# Patient Record
Sex: Female | Born: 1985 | Race: Asian | Hispanic: No | Marital: Married | Smoking: Never smoker
Health system: Southern US, Community
[De-identification: ages and names within clinical notes are randomized; demographics above are authoritative.]

## PROBLEM LIST (undated history)

## (undated) DIAGNOSIS — K259 Gastric ulcer, unspecified as acute or chronic, without hemorrhage or perforation: Secondary | ICD-10-CM

## (undated) DIAGNOSIS — Z227 Latent tuberculosis: Secondary | ICD-10-CM

## (undated) HISTORY — DX: Latent tuberculosis: Z22.7

## (undated) HISTORY — PX: NO PAST SURGERIES: SHX2092

## (undated) HISTORY — DX: Gastric ulcer, unspecified as acute or chronic, without hemorrhage or perforation: K25.9

---

## 2012-11-26 ENCOUNTER — Encounter: Payer: Self-pay | Admitting: Obstetrics and Gynecology

## 2012-11-26 ENCOUNTER — Ambulatory Visit (INDEPENDENT_AMBULATORY_CARE_PROVIDER_SITE_OTHER): Payer: Self-pay | Admitting: Obstetrics and Gynecology

## 2012-11-26 DIAGNOSIS — Z331 Pregnant state, incidental: Secondary | ICD-10-CM

## 2012-11-28 LAB — CULTURE, OB URINE
Colony Count: NO GROWTH
Organism ID, Bacteria: NO GROWTH

## 2012-11-29 LAB — PRENATAL PANEL VII
Eosinophils Absolute: 0.1 10*3/uL (ref 0.0–0.7)
HIV: NONREACTIVE
Hemoglobin: 12.2 g/dL (ref 12.0–15.0)
Hepatitis B Surface Ag: NEGATIVE
Lymphocytes Relative: 23 % (ref 12–46)
Lymphs Abs: 1.5 10*3/uL (ref 0.7–4.0)
MCH: 28.1 pg (ref 26.0–34.0)
Monocytes Relative: 7 % (ref 3–12)
Neutrophils Relative %: 68 % (ref 43–77)
Platelets: 218 10*3/uL (ref 150–400)
RBC: 4.34 MIL/uL (ref 3.87–5.11)
Rubella: 3.01 Index — ABNORMAL HIGH (ref <0.90)
WBC: 6.5 10*3/uL (ref 4.0–10.5)

## 2012-11-30 LAB — HEMOGLOBINOPATHY EVALUATION
Hemoglobin Other: 0 %
Hgb A: 96.6 % — ABNORMAL LOW (ref 96.8–97.8)
Hgb S Quant: 0 %

## 2012-11-30 NOTE — Progress Notes (Signed)
NOB interview completed.  Pt has a language barrier, husband translates for her.  Pt advised to start PNV ASAP.  NOB work up w/ AVS on 12/23/11.  Doing well.

## 2012-12-15 NOTE — L&D Delivery Note (Signed)
Attestation of Attending Supervision of Advanced Practitioner (CNM/NP): Evaluation and management procedures were performed by the Advanced Practitioner under my supervision and collaboration. I have reviewed the Advanced Practitioner's note and chart, and I agree with the management and plan.  [name] 10:37 PM   

## 2012-12-15 NOTE — L&D Delivery Note (Signed)
  Delivery Note At 7:31 PM a viable female was delivered via Vaginal, Spontaneous Delivery (Presentation: Left Occiput Anterior).  APGAR: 9, 9; weight 6 lb 10.3 oz (3014 g).   Placenta status: Intact, Spontaneous.  Cord: 3 vessels,  with the following complications: None.  Cord pH: not done  Anesthesia: Epidural  Episiotomy: None Lacerations: 2nd degree;Perineal Suture Repair: 3.0 monocryl Est. Blood Loss (mL): 150  Mom to postpartum.  Baby to nursery-stable. Breastfeeding, undecided about contraception- thinking about depo vs. pills  I was present for birth that was attended by Dr. Claiborne Billings, I agree w/ her note below  Marge Duncans 06/04/2013, 9:18 PM     Delivery Summary for [facility] 27 y.o.  G1P1 [redacted]w[redacted]d presented to [facility] in Early labor  Labor Events:   Preterm labor: NA  Rupture date: 06/04/2013  Rupture time: unknown  Rupture type: Spontaneous  Fluid Color: Bloody  Induction: NA  Augmentation: Pitocin  Complications: none  Cervical ripening:  none        Delivery:   Episiotomy: no  Lacerations: 2nd degree  Repair suture: 3-0 Monocryl  Repair # of packets: 1  Blood loss (ml): 150 mL   Information for the patient's newborn:  Burdette, Gergely [[id number]]    Delivery 06/04/2013 7:31 PM by  Vaginal, Spontaneous Delivery Sex:  female Gestational Age: <None> Delivery Clinician:  Natalia Leatherwood Living?: Yes        APGARS  One minute Five minutes Ten minutes  Skin color: 1   1      Heart rate: 2   2      Grimace: 2   2      Muscle tone: 2   2      Breathing: 2   2      Totals: 9  9      Presentation/position: Vertex  Left Occiput Anterior Resuscitation: None  Cord information:    Disposition of cord blood: No    Blood gases sent? No Complications: None  Placenta: Delivered: 06/04/2013 7:38 PM  Spontaneous  Intact appearance Newborn Measurements: Weight:   Height:   Head circumference:   Chest circumference:   Other providers:  Tarry Kos Ansah-Mensah  Additional  information: Forceps:   Vacuum:   Breech:   Observed anomalies

## 2012-12-22 ENCOUNTER — Ambulatory Visit (INDEPENDENT_AMBULATORY_CARE_PROVIDER_SITE_OTHER): Payer: Self-pay | Admitting: Obstetrics and Gynecology

## 2012-12-22 ENCOUNTER — Encounter: Payer: Self-pay | Admitting: Obstetrics and Gynecology

## 2012-12-22 VITALS — BP 100/62 | Ht 64.0 in | Wt 117.0 lb

## 2012-12-22 DIAGNOSIS — Z124 Encounter for screening for malignant neoplasm of cervix: Secondary | ICD-10-CM

## 2012-12-22 DIAGNOSIS — Z3689 Encounter for other specified antenatal screening: Secondary | ICD-10-CM

## 2012-12-22 DIAGNOSIS — Z331 Pregnant state, incidental: Secondary | ICD-10-CM

## 2012-12-22 LAB — OB RESULTS CONSOLE GC/CHLAMYDIA
Chlamydia: NEGATIVE
Gonorrhea: NEGATIVE

## 2012-12-22 NOTE — Patient Instructions (Signed)
ABCs of Pregnancy  A  Antepartum care is very important. Be sure you see your doctor and get prenatal care as soon as you think you are pregnant. At this time, you will be tested for infection, genetic abnormalities and potential problems with you and the pregnancy. This is the time to discuss diet, exercise, work, medications, labor, pain medication during labor and the possibility of a cesarean delivery. Ask any questions that may concern you. It is important to see your doctor regularly throughout your pregnancy. Avoid exposure to toxic substances and chemicals - such as cleaning solvents, lead and mercury, some insecticides, and paint. Pregnant women should avoid exposure to paint fumes, and fumes that cause you to feel ill, dizzy or faint. When possible, it is a good idea to have a pre-pregnancy consultation with your caregiver to begin some important recommendations your caregiver suggests such as, taking folic acid, exercising, quitting smoking, avoiding alcoholic beverages, etc.  B  Breastfeeding is the healthiest choice for both you and your baby. It has many nutritional benefits for the baby and health benefits for the mother. It also creates a very tight and loving bond between the baby and mother. Talk to your doctor, your family and friends, and your employer about how you choose to feed your baby and how they can support you in your decision. Not all birth defects can be prevented, but a woman can take actions that may increase her chance of having a healthy baby. Many birth defects happen very early in pregnancy, sometimes before a woman even knows she is pregnant. Birth defects or abnormalities of any child in your or the father's family should be discussed with your caregiver. Get a good support bra as your breast size changes. Wear it especially when you exercise and when nursing.   C  Celebrate the news of your pregnancy with the your spouse/father and family. Childbirth classes are helpful to  take for you and the spouse/father because it helps to understand what happens during the pregnancy, labor and delivery. Cesarean delivery should be discussed with your doctor so you are prepared for that possibility. The pros and cons of circumcision if it is a boy, should be discussed with your pediatrician. Cigarette smoking during pregnancy can result in low birth weight babies. It has been associated with infertility, miscarriages, tubal pregnancies, infant death (mortality) and poor health (morbidity) in childhood. Additionally, cigarette smoking may cause long-term learning disabilities. If you smoke, you should try to quit before getting pregnant and not smoke during the pregnancy. Secondary smoke may also harm a mother and her developing baby. It is a good idea to ask people to stop smoking around you during your pregnancy and after the baby is born. Extra calcium is necessary when you are pregnant and is found in your prenatal vitamin, in dairy products, green leafy vegetables and in calcium supplements.  D  A healthy diet according to your current weight and height, along with vitamins and mineral supplements should be discussed with your caregiver. Domestic abuse or violence should be made known to your doctor right away to get the situation corrected. Drink more water when you exercise to keep hydrated. Discomfort of your back and legs usually develops and progresses from the middle of the second trimester through to delivery of the baby. This is because of the enlarging baby and uterus, which may also affect your balance. Do not take illegal drugs. Illegal drugs can seriously harm the baby and you. Drink extra   fluids (water is best) throughout pregnancy to help your body keep up with the increases in your blood volume. Drink at least 6 to 8 glasses of water, fruit juice, or milk each day. A good way to know you are drinking enough fluid is when your urine looks almost like clear water or is very light  yellow.   E  Eat healthy to get the nutrients you and your unborn baby need. Your meals should include the five basic food groups. Exercise (30 minutes of light to moderate exercise a day) is important and encouraged during pregnancy, if there are no medical problems or problems with the pregnancy. Exercise that causes discomfort or dizziness should be stopped and reported to your caregiver. Emotions during pregnancy can change from being ecstatic to depression and should be understood by you, your partner and your family.  F  Fetal screening with ultrasound, amniocentesis and monitoring during pregnancy and labor is common and sometimes necessary. Take 400 micrograms of folic acid daily both before, when possible, and during the first few months of pregnancy to reduce the risk of birth defects of the brain and spine. All women who could possibly become pregnant should take a vitamin with folic acid, every day. It is also important to eat a healthy diet with fortified foods (enriched grain products, including cereals, rice, breads, and pastas) and foods with natural sources of folate (orange juice, green leafy vegetables, beans, peanuts, broccoli, asparagus, peas, and lentils). The father should be involved with all aspects of the pregnancy including, the prenatal care, childbirth classes, labor, delivery, and postpartum time. Fathers may also have emotional concerns about being a father, financial needs, and raising a family.  G  Genetic testing should be done appropriately. It is important to know your family and the father's history. If there have been problems with pregnancies or birth defects in your family, report these to your doctor. Also, genetic counselors can talk with you about the information you might need in making decisions about having a family. You can call a major medical center in your area for help in finding a board-certified genetic counselor. Genetic testing and counseling should be done  before pregnancy when possible, especially if there is a history of problems in the mother's or father's family. Certain ethnic backgrounds are more at risk for genetic defects.  H  Get familiar with the hospital where you will be having your baby. Get to know how long it takes to get there, the labor and delivery area, and the hospital procedures. Be sure your medical insurance is accepted there. Get your home ready for the baby including, clothes, the baby's room (when possible), furniture and car seat. Hand washing is important throughout the day, especially after handling raw meat and poultry, changing the baby's diaper or using the bathroom. This can help prevent the spread of many bacteria and viruses that cause infection. Your hair may become dry and thinner, but will return to normal a few weeks after the baby is born. Heartburn is a common problem that can be treated by taking antacids recommended by your caregiver, eating smaller meals 5 or 6 times a day, not drinking liquids when eating, drinking between meals and raising the head of your bed 2 to 3 inches.  I  Insurance to cover you, the baby, doctor and hospital should be reviewed so that you will be prepared to pay any costs not covered by your insurance plan. If you do not have medical insurance,   there are usually clinics and services available for you in your community. Take 30 milligrams of iron during your pregnancy as prescribed by your doctor to reduce the risk of low red blood cells (anemia) later in pregnancy. All women of childbearing age should eat a diet rich in iron.  J  There should be a joint effort for the mother, father and any other children to adapt to the pregnancy financially, emotionally, and psychologically during the pregnancy. Join a support group for moms-to-be. Or, join a class on parenting or childbirth. Have the family participate when possible.  K  Know your limits. Let your caregiver know if you experience any of the  following:   · Pain of any kind.  · Strong cramps.  · You develop a lot of weight in a short period of time (5 pounds in 3 to 5 days).  · Vaginal bleeding, leaking of amniotic fluid.  · Headache, vision problems.  · Dizziness, fainting, shortness of breath.  · Chest pain.  · Fever of 102° F (38.9° C) or higher.  · Gush of clear fluid from your vagina.  · Painful urination.  · Domestic violence.  · Irregular heartbeat (palpitations).  · Rapid beating of the heart (tachycardia).  · Constant feeling sick to your stomach (nauseous) and vomiting.  · Trouble walking, fluid retention (edema).  · Muscle weakness.  · If your baby has decreased activity.  · Persistent diarrhea.  · Abnormal vaginal discharge.  · Uterine contractions at 20-minute intervals.  · Back pain that travels down your leg.  L  Learn and practice that what you eat and drink should be in moderation and healthy for you and your baby. Legal drugs such as alcohol and caffeine are important issues for pregnant women. There is no safe amount of alcohol a woman can drink while pregnant. Fetal alcohol syndrome, a disorder characterized by growth retardation, facial abnormalities, and central nervous system dysfunction, is caused by a woman's use of alcohol during pregnancy. Caffeine, found in tea, coffee, soft drinks and chocolate, should also be limited. Be sure to read labels when trying to cut down on caffeine during pregnancy. More than 200 foods, beverages, and over-the-counter medications contain caffeine and have a high salt content! There are coffees and teas that do not contain caffeine.  M  Medical conditions such as diabetes, epilepsy, and high blood pressure should be treated and kept under control before pregnancy when possible, but especially during pregnancy. Ask your caregiver about any medications that may need to be changed or adjusted during pregnancy. If you are currently taking any medications, ask your caregiver if it is safe to take them  while you are pregnant or before getting pregnant when possible. Also, be sure to discuss any herbs or vitamins you are taking. They are medicines, too! Discuss with your doctor all medications, prescribed and over-the-counter, that you are taking. During your prenatal visit, discuss the medications your doctor may give you during labor and delivery.  N  Never be afraid to ask your doctor or caregiver questions about your health, the progress of the pregnancy, family problems, stressful situations, and recommendation for a pediatrician, if you do not have one. It is better to take all precautions and discuss any questions or concerns you may have during your office visits. It is a good idea to write down your questions before you visit the doctor.  O  Over-the-counter cough and cold remedies may contain alcohol or other ingredients that should   be avoided during pregnancy. Ask your caregiver about prescription, herbs or over-the-counter medications that you are taking or may consider taking while pregnant.   P  Physical activity during pregnancy can benefit both you and your baby by lessening discomfort and fatigue, providing a sense of well-being, and increasing the likelihood of early recovery after delivery. Light to moderate exercise during pregnancy strengthens the belly (abdominal) and back muscles. This helps improve posture. Practicing yoga, walking, swimming, and cycling on a stationary bicycle are usually safe exercises for pregnant women. Avoid scuba diving, exercise at high altitudes (over 3000 feet), skiing, horseback riding, contact sports, etc. Always check with your doctor before beginning any kind of exercise, especially during pregnancy and especially if you did not exercise before getting pregnant.  Q  Queasiness, stomach upset and morning sickness are common during pregnancy. Eating a couple of crackers or dry toast before getting out of bed. Foods that you normally love may make you feel sick to  your stomach. You may need to substitute other nutritious foods. Eating 5 or 6 small meals a day instead of 3 large ones may make you feel better. Do not drink with your meals, drink between meals. Questions that you have should be written down and asked during your prenatal visits.  R  Read about and make plans to baby-proof your home. There are important tips for making your home a safer environment for your baby. Review the tips and make your home safer for you and your baby. Read food labels regarding calories, salt and fat content in the food.  S  Saunas, hot tubs, and steam rooms should be avoided while you are pregnant. Excessive high heat may be harmful during your pregnancy. Your caregiver will screen and examine you for sexually transmitted diseases and genetic disorders during your prenatal visits. Learn the signs of labor. Sexual relations while pregnant is safe unless there is a medical or pregnancy problem and your caregiver advises against it.  T  Traveling long distances should be avoided especially in the third trimester of your pregnancy. If you do have to travel out of state, be sure to take a copy of your medical records and medical insurance plan with you. You should not travel long distances without seeing your doctor first. Most airlines will not allow you to travel after 36 weeks of pregnancy. Toxoplasmosis is an infection caused by a parasite that can seriously harm an unborn baby. Avoid eating undercooked meat and handling cat litter. Be sure to wear gloves when gardening. Tingling of the hands and fingers is not unusual and is due to fluid retention. This will go away after the baby is born.  U  Womb (uterus) size increases during the first trimester. Your kidneys will begin to function more efficiently. This may cause you to feel the need to urinate more often. You may also leak urine when sneezing, coughing or laughing. This is due to the growing uterus pressing against your bladder,  which lies directly in front of and slightly under the uterus during the first few months of pregnancy. If you experience burning along with frequency of urination or bloody urine, be sure to tell your doctor. The size of your uterus in the third trimester may cause a problem with your balance. It is advisable to maintain good posture and avoid wearing high heels during this time. An ultrasound of your baby may be necessary during your pregnancy and is safe for you and your baby.  V    Vaccinations are an important concern for pregnant women. Get needed vaccines before pregnancy. Center for Disease Control (www.cdc.gov) has clear guidelines for the use of vaccines during pregnancy. Review the list, be sure to discuss it with your doctor. Prenatal vitamins are helpful and healthy for you and the baby. Do not take extra vitamins except what is recommended. Taking too much of certain vitamins can cause overdose problems. Continuous vomiting should be reported to your caregiver. Varicose veins may appear especially if there is a family history of varicose veins. They should subside after the delivery of the baby. Support hose helps if there is leg discomfort.  W  Being overweight or underweight during pregnancy may cause problems. Try to get within 15 pounds of your ideal weight before pregnancy. Remember, pregnancy is not a time to be dieting! Do not stop eating or start skipping meals as your weight increases. Both you and your baby need the calories and nutrition you receive from a healthy diet. Be sure to consult with your doctor about your diet. There is a formula and diet plan available depending on whether you are overweight or underweight. Your caregiver or nutritionist can help and advise you if necessary.  X  Avoid X-rays. If you must have dental work or diagnostic tests, tell your dentist or physician that you are pregnant so that extra care can be taken. X-rays should only be taken when the risks of not taking  them outweigh the risk of taking them. If needed, only the minimum amount of radiation should be used. When X-rays are necessary, protective lead shields should be used to cover areas of the body that are not being X-rayed.  Y  Your baby loves you. Breastfeeding your baby creates a loving and very close bond between the two of you. Give your baby a healthy environment to live in while you are pregnant. Infants and children require constant care and guidance. Their health and safety should be carefully watched at all times. After the baby is born, rest or take a nap when the baby is sleeping.  Z  Get your ZZZs. Be sure to get plenty of rest. Resting on your side as often as possible, especially on your left side is advised. It provides the best circulation to your baby and helps reduce swelling. Try taking a nap for 30 to 45 minutes in the afternoon when possible. After the baby is born rest or take a nap when the baby is sleeping. Try elevating your feet for that amount of time when possible. It helps the circulation in your legs and helps reduce swelling.   Most information courtesy of the CDC.  Document Released: 12/01/2005 Document Revised: 02/23/2012 Document Reviewed: 08/15/2009  [employer]® Patient Information ©2013 [employer].

## 2012-12-22 NOTE — Progress Notes (Signed)
[facility] NEW OB EXAMINATION   Elizabeth Foley is a 27 y.o. female, G1P0, who presents at [redacted]w[redacted]d gestation for a new obstetrical examination. Her husband is her interpreter. She denies problems with this pregnancy. She says her last menstrual period was normal.  The following portions of the patient's history were reviewed and updated as appropriate: allergies, current medications, past family history, past medical history, past social history, past surgical history and problem list.  OB History    Grav Para Term Preterm Abortions TAB SAB Ect Mult Living   1               Past Medical History  Diagnosis Date  . No pertinent past medical history     Past Surgical History  Procedure Date  . No past surgeries     No family history on file.  Social History:  reports that she has never smoked. She has never used smokeless tobacco. She reports that she does not drink alcohol or use illicit drugs.  Allergies: No Known Allergies  Medications: prenatal vitamins   Objective:    BP 100/62  Ht 5\' 4"  (1.626 m)  Wt 117 lb (53.071 kg)  BMI 20.08 kg/m2  LMP 08/30/2012    Weight:  Wt Readings from Last 1 Encounters:  12/22/12 117 lb (53.071 kg)          BMI: Body mass index is 20.08 kg/(m^2).  General Appearance: Alert, appropriate appearance for age. No acute distress HEENT: Grossly normal Neck / Thyroid: Supple, no masses, nodes or enlargement Lungs: clear to auscultation bilaterally Back: No CVA tenderness Breast Exam: No masses or nodes.No dimpling, nipple retraction or discharge. Cardiovascular: Regular rate and rhythm. S1, S2, no murmur Gastrointestinal: Soft, non-tender, no masses or organomegaly.                               Fundal height: 16 weeks                               Fetal heart tones audible: yes  ++++++++++++++++++++++++++++++++++++++++++++++++++++++++  Pelvic Exam: External genitalia: normal general appearance Vaginal: normal without tenderness, induration or  masses and relaxation: No Cervix: normal appearance Adnexa: normal bimanual exam Uterus: gravid, nontender, 16 weeks size  ++++++++++++++++++++++++++++++++++++++++++++++++++++++++  Lymphatic Exam: Non-palpable nodes in neck, clavicular, axillary, or inguinal regions Neurologic: Normal speech, no tremor  Psychiatric: Alert and oriented, appropriate affect.  Prenatal labs: ABO, Rh: O/POS/-- (12/13 0935) Antibody: NEG (12/13 0935) Rubella:  immune RPR: NON REAC (12/13 0935)  HBsAg: NEGATIVE (12/13 0935)  HIV: NON REACTIVE (12/13 0935)  GBS:   pending until the third trimester Urine culture: Negative Sickle cell: Normal adult hemoglobin Hemoglobin: 12.2 Platelets: 218,000  Wet Prep:   Previously done:            no                     If no: Whiff:                     Negative                              Clue cells:             no  PH:                        4.5                              Yeast:                    no                              Trichomoniasis:    no   Assessment:   27 y.o. female G1P0 at [redacted]w[redacted]d gestation ( EDC is June 06, 2013) by: Normal Last menstrual period: yes                                Late prenatal care   Plan:    GC and Chlamydia sent.  Pap smear sent.  We discussed routine pregnancy issues:  Toxoplasmosis was reviewed.  The patient was told to avoid cat liter boxes and feces.  The patient was told to avoid predator fish including tuna because of our concerns for mercury consumption.  The patient was told to avoid soft cheeses.  The patient was told to be sure that all lunch meats are well cooked.  Genetic screening was discussed. The patient wants to have a genetic screen at next visit (when she has Medicaid).  Our model for pregnancy management was reviewed.  Proper diet and exercise reviewed.  Return to office in 4 weeks.  Medications include:  Prenatal vitamins  ABC's of pregnancy  given.  Mylinda Latina.D.

## 2012-12-22 NOTE — Progress Notes (Signed)
[redacted]w[redacted]d  Last Pap: per husband never Pt would like to discuss Genetic Testing. Pt states she has no concerns. Pt states she has already had a flu shot.

## 2012-12-23 ENCOUNTER — Telehealth: Payer: Self-pay | Admitting: Obstetrics and Gynecology

## 2012-12-23 MED ORDER — CONCEPT OB 130-92.4-1 MG PO CAPS: 1.0000 | ORAL_CAPSULE | Freq: Every day | ORAL | Status: DC

## 2012-12-23 NOTE — Telephone Encounter (Signed)
Lm on vm rx sen to pharm pt voice understanding

## 2012-12-24 LAB — PAP IG, CT-NG, RFX HPV ASCU
Chlamydia Probe Amp: NEGATIVE
GC Probe Amp: NEGATIVE

## 2013-01-19 ENCOUNTER — Other Ambulatory Visit: Payer: Self-pay

## 2013-01-19 ENCOUNTER — Encounter: Payer: Self-pay | Admitting: Obstetrics and Gynecology

## 2013-01-31 ENCOUNTER — Other Ambulatory Visit: Payer: Self-pay

## 2013-01-31 DIAGNOSIS — Z331 Pregnant state, incidental: Secondary | ICD-10-CM

## 2013-01-31 LAB — HIV ANTIBODY (ROUTINE TESTING W REFLEX): HIV: NONREACTIVE

## 2013-01-31 NOTE — Progress Notes (Signed)
PRENATAL LABS DONE TODAY Elizabeth Foley 

## 2013-02-01 LAB — OBSTETRIC PANEL
Antibody Screen: NEGATIVE
Basophils Absolute: 0 10*3/uL (ref 0.0–0.1)
Basophils Relative: 0 % (ref 0–1)
Eosinophils Relative: 1 % (ref 0–5)
HCT: 32.3 % — ABNORMAL LOW (ref 36.0–46.0)
Hemoglobin: 10.8 g/dL — ABNORMAL LOW (ref 12.0–15.0)
MCH: 28.9 pg (ref 26.0–34.0)
MCHC: 33.4 g/dL (ref 30.0–36.0)
MCV: 86.4 fL (ref 78.0–100.0)
Monocytes Absolute: 0.5 10*3/uL (ref 0.1–1.0)
Monocytes Relative: 7 % (ref 3–12)
Neutro Abs: 5.5 10*3/uL (ref 1.7–7.7)
RDW: 13.8 % (ref 11.5–15.5)
Rh Type: POSITIVE

## 2013-02-07 ENCOUNTER — Encounter: Payer: Self-pay | Admitting: Family Medicine

## 2013-02-11 ENCOUNTER — Encounter: Payer: Self-pay | Admitting: Family Medicine

## 2013-02-11 ENCOUNTER — Ambulatory Visit (INDEPENDENT_AMBULATORY_CARE_PROVIDER_SITE_OTHER): Payer: Self-pay | Admitting: Family Medicine

## 2013-02-11 VITALS — BP 109/69 | Wt 131.3 lb

## 2013-02-11 DIAGNOSIS — Z3400 Encounter for supervision of normal first pregnancy, unspecified trimester: Secondary | ICD-10-CM | POA: Insufficient documentation

## 2013-02-11 DIAGNOSIS — Z3402 Encounter for supervision of normal first pregnancy, second trimester: Secondary | ICD-10-CM

## 2013-02-11 NOTE — Patient Instructions (Signed)
TSH today, continue daily vitamins, MAU for emergency, Next appt in 4 weeks  Pregnancy - Second Trimester The second trimester of pregnancy (3 to 6 months) is a period of rapid growth for you and your baby. At the end of the sixth month, your baby is about 9 inches long and weighs 1 1/2 pounds. You will begin to feel the baby move between 18 and 20 weeks of the pregnancy. This is called quickening. Weight gain is faster. A clear fluid (colostrum) may leak out of your breasts. You may feel small contractions of the womb (uterus). This is known as false labor or Braxton-Hicks contractions. This is like a practice for labor when the baby is ready to be born. Usually, the problems with morning sickness have usually passed by the end of your first trimester. Some women develop small dark blotches (called cholasma, mask of pregnancy) on their face that usually goes away after the baby is born. Exposure to the sun makes the blotches worse. Acne may also develop in some pregnant women and pregnant women who have acne, may find that it goes away. PRENATAL EXAMS  Blood work may continue to be done during prenatal exams. These tests are done to check on your health and the probable health of your baby. Blood work is used to follow your blood levels (hemoglobin). Anemia (low hemoglobin) is common during pregnancy. Iron and vitamins are given to help prevent this. You will also be checked for diabetes between 24 and 28 weeks of the pregnancy. Some of the previous blood tests may be repeated.  The size of the uterus is measured during each visit. This is to make sure that the baby is continuing to grow properly according to the dates of the pregnancy.  Your blood pressure is checked every prenatal visit. This is to make sure you are not getting toxemia.  Your urine is checked to make sure you do not have an infection, diabetes or protein in the urine.  Your weight is checked often to make sure gains are happening at  the suggested rate. This is to ensure that both you and your baby are growing normally.  Sometimes, an ultrasound is performed to confirm the proper growth and development of the baby. This is a test which bounces harmless sound waves off the baby so your caregiver can more accurately determine due dates. Sometimes, a specialized test is done on the amniotic fluid surrounding the baby. This test is called an amniocentesis. The amniotic fluid is obtained by sticking a needle into the belly (abdomen). This is done to check the chromosomes in instances where there is a concern about possible genetic problems with the baby. It is also sometimes done near the end of pregnancy if an early delivery is required. In this case, it is done to help make sure the baby's lungs are mature enough for the baby to live outside of the womb. CHANGES OCCURING IN THE SECOND TRIMESTER OF PREGNANCY Your body goes through many changes during pregnancy. They vary from person to person. Talk to your caregiver about changes you notice that you are concerned about.  During the second trimester, you will likely have an increase in your appetite. It is normal to have cravings for certain foods. This varies from person to person and pregnancy to pregnancy.  Your lower abdomen will begin to bulge.  You may have to urinate more often because the uterus and baby are pressing on your bladder. It is also common to  get more bladder infections during pregnancy (pain with urination). You can help this by drinking lots of fluids and emptying your bladder before and after intercourse.  You may begin to get stretch marks on your hips, abdomen, and breasts. These are normal changes in the body during pregnancy. There are no exercises or medications to take that prevent this change.  You may begin to develop swollen and bulging veins (varicose veins) in your legs. Wearing support hose, elevating your feet for 15 minutes, 3 to 4 times a day and  limiting salt in your diet helps lessen the problem.  Heartburn may develop as the uterus grows and pushes up against the stomach. Antacids recommended by your caregiver helps with this problem. Also, eating smaller meals 4 to 5 times a day helps.  Constipation can be treated with a stool softener or adding bulk to your diet. Drinking lots of fluids, vegetables, fruits, and whole grains are helpful.  Exercising is also helpful. If you have been very active up until your pregnancy, most of these activities can be continued during your pregnancy. If you have been less active, it is helpful to start an exercise program such as walking.  Hemorrhoids (varicose veins in the rectum) may develop at the end of the second trimester. Warm sitz baths and hemorrhoid cream recommended by your caregiver helps hemorrhoid problems.  Backaches may develop during this time of your pregnancy. Avoid heavy lifting, wear low heal shoes and practice good posture to help with backache problems.  Some pregnant women develop tingling and numbness of their hand and fingers because of swelling and tightening of ligaments in the wrist (carpel tunnel syndrome). This goes away after the baby is born.  As your breasts enlarge, you may have to get a bigger bra. Get a comfortable, cotton, support bra. Do not get a nursing bra until the last month of the pregnancy if you will be nursing the baby.  You may get a dark line from your belly button to the pubic area called the linea nigra.  You may develop rosy cheeks because of increase blood flow to the face.  You may develop spider looking lines of the face, neck, arms and chest. These go away after the baby is born. HOME CARE INSTRUCTIONS   It is extremely important to avoid all smoking, herbs, alcohol, and unprescribed drugs during your pregnancy. These chemicals affect the formation and growth of the baby. Avoid these chemicals throughout the pregnancy to ensure the delivery of  a healthy infant.  Most of your home care instructions are the same as suggested for the first trimester of your pregnancy. Keep your caregiver's appointments. Follow your caregiver's instructions regarding medication use, exercise and diet.  During pregnancy, you are providing food for you and your baby. Continue to eat regular, well-balanced meals. Choose foods such as meat, fish, milk and other low fat dairy products, vegetables, fruits, and whole-grain breads and cereals. Your caregiver will tell you of the ideal weight gain.  A physical sexual relationship may be continued up until near the end of pregnancy if there are no other problems. Problems could include early (premature) leaking of amniotic fluid from the membranes, vaginal bleeding, abdominal pain, or other medical or pregnancy problems.  Exercise regularly if there are no restrictions. Check with your caregiver if you are unsure of the safety of some of your exercises. The greatest weight gain will occur in the last 2 trimesters of pregnancy. Exercise will help you:  Control  your weight.  Get you in shape for labor and delivery.  Lose weight after you have the baby.  Wear a good support or jogging bra for breast tenderness during pregnancy. This may help if worn during sleep. Pads or tissues may be used in the bra if you are leaking colostrum.  Do not use hot tubs, steam rooms or saunas throughout the pregnancy.  Wear your seat belt at all times when driving. This protects you and your baby if you are in an accident.  Avoid raw meat, uncooked cheese, cat litter boxes and soil used by cats. These carry germs that can cause birth defects in the baby.  The second trimester is also a good time to visit your dentist for your dental health if this has not been done yet. Getting your teeth cleaned is OK. Use a soft toothbrush. Brush gently during pregnancy.  It is easier to loose urine during pregnancy. Tightening up and  strengthening the pelvic muscles will help with this problem. Practice stopping your urination while you are going to the bathroom. These are the same muscles you need to strengthen. It is also the muscles you would use as if you were trying to stop from passing gas. You can practice tightening these muscles up 10 times a set and repeating this about 3 times per day. Once you know what muscles to tighten up, do not perform these exercises during urination. It is more likely to contribute to an infection by backing up the urine.  Ask for help if you have financial, counseling or nutritional needs during pregnancy. Your caregiver will be able to offer counseling for these needs as well as refer you for other special needs.  Your skin may become oily. If so, wash your face with mild soap, use non-greasy moisturizer and oil or cream based makeup. MEDICATIONS AND DRUG USE IN PREGNANCY  Take prenatal vitamins as directed. The vitamin should contain 1 milligram of folic acid. Keep all vitamins out of reach of children. Only a couple vitamins or tablets containing iron may be fatal to a baby or young child when ingested.  Avoid use of all medications, including herbs, over-the-counter medications, not prescribed or suggested by your caregiver. Only take over-the-counter or prescription medicines for pain, discomfort, or fever as directed by your caregiver. Do not use aspirin.  Let your caregiver also know about herbs you may be using.  Alcohol is related to a number of birth defects. This includes fetal alcohol syndrome. All alcohol, in any form, should be avoided completely. Smoking will cause low birth rate and premature babies.  Street or illegal drugs are very harmful to the baby. They are absolutely forbidden. A baby born to an addicted mother will be addicted at birth. The baby will go through the same withdrawal an adult does. SEEK MEDICAL CARE IF:  You have any concerns or worries during your  pregnancy. It is better to call with your questions if you feel they cannot wait, rather than worry about them. SEEK IMMEDIATE MEDICAL CARE IF:   An unexplained oral temperature above 102 F (38.9 C) develops, or as your caregiver suggests.  You have leaking of fluid from the vagina (birth canal). If leaking membranes are suspected, take your temperature and tell your caregiver of this when you call.  There is vaginal spotting, bleeding, or passing clots. Tell your caregiver of the amount and how many pads are used. Light spotting in pregnancy is common, especially following intercourse.  You  develop a bad smelling vaginal discharge with a change in the color from clear to white.  You continue to feel sick to your stomach (nauseated) and have no relief from remedies suggested. You vomit blood or coffee ground-like materials.  You lose more than 2 pounds of weight or gain more than 2 pounds of weight over 1 week, or as suggested by your caregiver.  You notice swelling of your face, hands, feet, or legs.  You get exposed to [place] measles and have never had them.  You are exposed to fifth disease or chickenpox.  You develop belly (abdominal) pain. Round ligament discomfort is a common non-cancerous (benign) cause of abdominal pain in pregnancy. Your caregiver still must evaluate you.  You develop a bad headache that does not go away.  You develop fever, diarrhea, pain with urination, or shortness of breath.  You develop visual problems, blurry, or double vision.  You fall or are in a car accident or any kind of trauma.  There is mental or physical violence at home. Document Released: 11/25/2001 Document Revised: 02/23/2012 Document Reviewed: 05/30/2009 [facility] Patient Information 2013 [place], [place].

## 2013-02-12 ENCOUNTER — Encounter: Payer: Self-pay | Admitting: Family Medicine

## 2013-02-12 NOTE — Progress Notes (Signed)
Elizabeth Foley is a 27 y.o.  [redacted]w[redacted]d G1P0 female presenting to [facility] for [facility] care. She has had a initial visit with Dr. Stefano Gaul, with late prenatal care at 16w 2d. Initial prenatal lab work and PAP smear were completed at that visit. Patient does not speak [place] well. Her husband has been translating for her.   S: [name] ( pronounced "nowc"), only complaint is difficulty sleeping. She is unable to get comfortable. Otherwise she is doing well. She has filled out a pregnancy home risk form with her husband translating. A complete review of her allergies, medications, PMH, surgical history, Social History and FH were completed. Patient immigrated to [place] from [place] last year in July (2013). Her immunizations are unclear, however she will bring a record in for her next visit. She denies any complications (leaking fluid, bleeding or cramping) thus far. She has felt her baby move. She is taking a PNV with Iron and does not have any problems with nausea or vomit. She admits to irregular menstrual cycles and is uncertain if her LMP is accurate.  O: BP 109/69  Wt 131 lb 4.8 oz (59.557 kg)  BMI 22.53 kg/m2  LMP 08/30/2012 GEN: VSS. Needs translator to communicate.  CV: RRR. No murmur. Lungs: CTAB ABD: Gravid. fundal height: 21cm (small for suggested GA). FHR obtained by doppler:  155.  EXT: Trace edema bilaterally.   A/P:  - Fetal anatomy US ordered for possible SGA vs GA. Possible incorrect dates contributing to discrepancy. Patient and husband also desire to know gender of baby.  - TSH today normal. Patient was uncertain of she had thyroid issues in the past.  - Initial prenatal labs are normal. PAP smear and G/C were also normal.  - HBSag WNL - Immunization records needed for TB and Varicella records. Patient will bring to next visit. Tdap next visit. - PTL, emergency situations and [facility] discussed and explained.  - [place], parenting and breast feeding classes offered and currently  declined, but patient will think about it.   - Patient desires to formula feed. Will address this in more detail with a translator on next visit.  - Language Barrier: I have requested a Systems developer, for next visit, with the front desk.   -- Will need to cover home safety with her and breast feeding benefits. - If [place] proves dates are good, then will need 1 hour Glucola next visit, I have placed a future order for her to receive this and made front desk aware by LOS to make time for this next visit.  - Next appt will be scheduled for [facility] in 4 weeks.

## 2013-02-14 NOTE — Progress Notes (Signed)
Chart reviewed and discussed with Dr. Claiborne Billings, I agree with her assessment and plan.  Needs [place] for dating as well as anatomy scan, may be referred to Dr. Elsie Stain practice if [facility] if she has orange card).  Agree with plan to establish [place] interpreter for future OB visits.  Will need 1hrGTT at next visit. Paula Compton, MD

## 2013-02-16 NOTE — Addendum Note (Signed)
Addended by: Damita Lack on: 02/16/2013 09:39 AM   Modules accepted: Orders

## 2013-03-10 ENCOUNTER — Ambulatory Visit (INDEPENDENT_AMBULATORY_CARE_PROVIDER_SITE_OTHER): Payer: Medicaid Other | Admitting: Family Medicine

## 2013-03-10 VITALS — BP 107/68 | Temp 97.7°F | Wt 137.4 lb

## 2013-03-10 DIAGNOSIS — Z3403 Encounter for supervision of normal first pregnancy, third trimester: Secondary | ICD-10-CM

## 2013-03-10 DIAGNOSIS — Z3400 Encounter for supervision of normal first pregnancy, unspecified trimester: Secondary | ICD-10-CM

## 2013-03-10 NOTE — Patient Instructions (Addendum)
Pregnancy - Third Trimester  The third trimester of pregnancy (the last 3 months) is a period of the most rapid growth for you and your baby. The baby approaches a length of 20 inches and a weight of 6 to 10 pounds. The baby is adding on fat and getting ready for life outside your body. While inside, babies have periods of sleeping and waking, suck their thumbs, and hiccups. You can often feel small contractions of the uterus. This is false labor. It is also called Braxton-Hicks contractions. This is like a practice for labor. The usual problems in this stage of pregnancy include more difficulty breathing, swelling of the hands and feet from water retention, and having to urinate more often because of the uterus and baby pressing on your bladder.   PRENATAL EXAMS  · Blood work may continue to be done during prenatal exams. These tests are done to check on your health and the probable health of your baby. Blood work is used to follow your blood levels (hemoglobin). Anemia (low hemoglobin) is common during pregnancy. Iron and vitamins are given to help prevent this. You may also continue to be checked for diabetes. Some of the past blood tests may be done again.  · The size of the uterus is measured during each visit. This makes sure your baby is growing properly according to your pregnancy dates.  · Your blood pressure is checked every prenatal visit. This is to make sure you are not getting toxemia.  · Your urine is checked every prenatal visit for infection, diabetes and protein.  · Your weight is checked at each visit. This is done to make sure gains are happening at the suggested rate and that you and your baby are growing normally.  · Sometimes, an ultrasound is performed to confirm the position and the proper growth and development of the baby. This is a test done that bounces harmless sound waves off the baby so your caregiver can more accurately determine due dates.  · Discuss the type of pain medication and  anesthesia you will have during your labor and delivery.  · Discuss the possibility and anesthesia if a Cesarean Section might be necessary.  · Inform your caregiver if there is any mental or physical violence at home.  Sometimes, a specialized non-stress test, contraction stress test and biophysical profile are done to make sure the baby is not having a problem. Checking the amniotic fluid surrounding the baby is called an amniocentesis. The amniotic fluid is removed by sticking a needle into the belly (abdomen). This is sometimes done near the end of pregnancy if an early delivery is required. In this case, it is done to help make sure the baby's lungs are mature enough for the baby to live outside of the womb. If the lungs are not mature and it is unsafe to deliver the baby, an injection of cortisone medication is given to the mother 1 to 2 days before the delivery. This helps the baby's lungs mature and makes it safer to deliver the baby.  CHANGES OCCURING IN THE THIRD TRIMESTER OF PREGNANCY  Your body goes through many changes during pregnancy. They vary from person to person. Talk to your caregiver about changes you notice and are concerned about.  · During the last trimester, you have probably had an increase in your appetite. It is normal to have cravings for certain foods. This varies from person to person and pregnancy to pregnancy.  · You may begin to   get stretch marks on your hips, abdomen, and breasts. These are normal changes in the body during pregnancy. There are no exercises or medications to take which prevent this change.  · Constipation may be treated with a stool softener or adding bulk to your diet. Drinking lots of fluids, fiber in vegetables, fruits, and whole grains are helpful.  · Exercising is also helpful. If you have been very active up until your pregnancy, most of these activities can be continued during your pregnancy. If you have been less active, it is helpful to start an exercise  program such as walking. Consult your caregiver before starting exercise programs.  · Avoid all smoking, alcohol, un-prescribed drugs, herbs and "street drugs" during your pregnancy. These chemicals affect the formation and growth of the baby. Avoid chemicals throughout the pregnancy to ensure the delivery of a healthy infant.  · Backache, varicose veins and hemorrhoids may develop or get worse.  · You will tire more easily in the third trimester, which is normal.  · The baby's movements may be stronger and more often.  · You may become short of breath easily.  · Your belly button may stick out.  · A yellow discharge may leak from your breasts called colostrum.  · You may have a bloody mucus discharge. This usually occurs a few days to a week before labor begins.  HOME CARE INSTRUCTIONS   · Keep your caregiver's appointments. Follow your caregiver's instructions regarding medication use, exercise, and diet.  · During pregnancy, you are providing food for you and your baby. Continue to eat regular, well-balanced meals. Choose foods such as meat, fish, milk and other low fat dairy products, vegetables, fruits, and whole-grain breads and cereals. Your caregiver will tell you of the ideal weight gain.  · A physical sexual relationship may be continued throughout pregnancy if there are no other problems such as early (premature) leaking of amniotic fluid from the membranes, vaginal bleeding, or belly (abdominal) pain.  · Exercise regularly if there are no restrictions. Check with your caregiver if you are unsure of the safety of your exercises. Greater weight gain will occur in the last 2 trimesters of pregnancy. Exercising helps:  · Control your weight.  · Get you in shape for labor and delivery.  · You lose weight after you deliver.  · Rest a lot with legs elevated, or as needed for leg cramps or low back pain.  · Wear a good support or jogging bra for breast tenderness during pregnancy. This may help if worn during  sleep. Pads or tissues may be used in the bra if you are leaking colostrum.  · Do not use hot tubs, steam rooms, or saunas.  · Wear your seat belt when driving. This protects you and your baby if you are in an accident.  · Avoid raw meat, cat litter boxes and soil used by cats. These carry germs that can cause birth defects in the baby.  · It is easier to loose urine during pregnancy. Tightening up and strengthening the pelvic muscles will help with this problem. You can practice stopping your urination while you are going to the bathroom. These are the same muscles you need to strengthen. It is also the muscles you would use if you were trying to stop from passing gas. You can practice tightening these muscles up 10 times a set and repeating this about 3 times per day. Once you know what muscles to tighten up, do not perform these   exercises during urination. It is more likely to cause an infection by backing up the urine.  · Ask for help if you have financial, counseling or nutritional needs during pregnancy. Your caregiver will be able to offer counseling for these needs as well as refer you for other special needs.  · Make a list of emergency phone numbers and have them available.  · Plan on getting help from family or friends when you go home from the hospital.  · Make a trial run to the hospital.  · Take prenatal classes with the father to understand, practice and ask questions about the labor and delivery.  · Prepare the baby's room/nursery.  · Do not travel out of the city unless it is absolutely necessary and with the advice of your caregiver.  · Wear only low or no heal shoes to have better balance and prevent falling.  MEDICATIONS AND DRUG USE IN PREGNANCY  · Take prenatal vitamins as directed. The vitamin should contain 1 milligram of folic acid. Keep all vitamins out of reach of children. Only a couple vitamins or tablets containing iron may be fatal to a baby or young child when ingested.  · Avoid use  of all medications, including herbs, over-the-counter medications, not prescribed or suggested by your caregiver. Only take over-the-counter or prescription medicines for pain, discomfort, or fever as directed by your caregiver. Do not use aspirin, ibuprofen (Motrin®, Advil®, Nuprin®) or naproxen (Aleve®) unless OK'd by your caregiver.  · Let your caregiver also know about herbs you may be using.  · Alcohol is related to a number of birth defects. This includes fetal alcohol syndrome. All alcohol, in any form, should be avoided completely. Smoking will cause low birth rate and premature babies.  · Street/illegal drugs are very harmful to the baby. They are absolutely forbidden. A baby born to an addicted mother will be addicted at birth. The baby will go through the same withdrawal an adult does.  SEEK MEDICAL CARE IF:  You have any concerns or worries during your pregnancy. It is better to call with your questions if you feel they cannot wait, rather than worry about them.  DECISIONS ABOUT CIRCUMCISION  You may or may not know the sex of your baby. If you know your baby is a boy, it may be time to think about circumcision. Circumcision is the removal of the foreskin of the penis. This is the skin that covers the sensitive end of the penis. There is no proven medical need for this. Often this decision is made on what is popular at the time or based upon religious beliefs and social issues. You can discuss these issues with your caregiver or pediatrician.  SEEK IMMEDIATE MEDICAL CARE IF:   · An unexplained oral temperature above 102° F (38.9° C) develops, or as your caregiver suggests.  · You have leaking of fluid from the vagina (birth canal). If leaking membranes are suspected, take your temperature and tell your caregiver of this when you call.  · There is vaginal spotting, bleeding or passing clots. Tell your caregiver of the amount and how many pads are used.  · You develop a bad smelling vaginal discharge with  a change in the color from clear to white.  · You develop vomiting that lasts more than 24 hours.  · You develop chills or fever.  · You develop shortness of breath.  · You develop burning on urination.  · You loose more than 2 pounds of weight   or gain more than 2 pounds of weight or as suggested by your caregiver.  · You notice sudden swelling of your face, hands, and feet or legs.  · You develop belly (abdominal) pain. Round ligament discomfort is a common non-cancerous (benign) cause of abdominal pain in pregnancy. Your caregiver still must evaluate you.  · You develop a severe headache that does not go away.  · You develop visual problems, blurred or double vision.  · If you have not felt your baby move for more than 1 hour. If you think the baby is not moving as much as usual, eat something with sugar in it and lie down on your left side for an hour. The baby should move at least 4 to 5 times per hour. Call right away if your baby moves less than that.  · You fall, are in a car accident or any kind of trauma.  · There is mental or physical violence at home.  Document Released: 11/25/2001 Document Revised: 02/23/2012 Document Reviewed: 05/30/2009  ExitCare® Patient Information ©2013 [employer].

## 2013-03-11 NOTE — Progress Notes (Signed)
Elizabeth Foley returns today for her scheduled [redacted]w[redacted]d prenatal appointment. She had late prenatal care and is catching up on lab work. A translator was requested (vietnamese), however one was not supplied. Her husband translated for her today. She is feeling tired and is worried about her weight gain. She feels she is gaining too much weight. She reports she always feels hungry. They went to Dr. Elsie Stain and received an [place] (@[redacted]w[redacted]d ). It resulted with a single breech female baby, otherwise normal with good dates with LMP. She denies leak, gush of fluid or bleeding. She has not felt any contractions. She brings with her vaccinations records from her immigration. She is taking prenatal vitamins. She was scheduled for 1 GTT today, but that did not completed, possibly d/t language barrier. She is able to come back Wednesday to complete the lab.  O: BP 107/68  Temp(Src) 97.7 F (36.5 C)  Wt 137 lb 6.4 oz (62.324 kg)  BMI 23.57 kg/m2  LMP 08/30/2012 See above table. A/P: - Immunization record: Varicella titre positive (2013), Hep B series completed, TB test was negative - Tetanus today. - 1 GTT rescheduled for Wednesday - Discussed weight gain, and praised her on her current weight and that she is normal.  - discussed MAU location, kick counts, PTL, vaginal bleeding and contractions.  - next appointment with [facility] (translator requested)

## 2013-03-14 NOTE — Progress Notes (Signed)
Chart reviewed and I agree with Dr. Alan Ripper assessment and plan.  Patient is to have 1hGTT this week.  Interpreter to assist in completing the Pregnancy Medical Home and PHQ9 depression screens at next visit.  For Tdap at next visit as well. Paula Compton, MD

## 2013-03-16 ENCOUNTER — Other Ambulatory Visit (INDEPENDENT_AMBULATORY_CARE_PROVIDER_SITE_OTHER): Payer: Medicaid Other

## 2013-03-16 DIAGNOSIS — Z3400 Encounter for supervision of normal first pregnancy, unspecified trimester: Secondary | ICD-10-CM

## 2013-03-16 DIAGNOSIS — Z331 Pregnant state, incidental: Secondary | ICD-10-CM

## 2013-03-16 LAB — GLUCOSE, CAPILLARY
Comment 1: 1
Glucose-Capillary: 134 mg/dL — ABNORMAL HIGH (ref 70–99)

## 2013-03-16 NOTE — Progress Notes (Signed)
1 HR glucose = 134 mg/dL   Pt given 50 grams glucola  BAJORDAN, MLS

## 2013-04-07 ENCOUNTER — Ambulatory Visit (INDEPENDENT_AMBULATORY_CARE_PROVIDER_SITE_OTHER): Payer: Medicaid Other | Admitting: Family Medicine

## 2013-04-07 VITALS — BP 110/67 | Temp 97.9°F | Wt 138.5 lb

## 2013-04-07 DIAGNOSIS — Z3400 Encounter for supervision of normal first pregnancy, unspecified trimester: Secondary | ICD-10-CM

## 2013-04-07 DIAGNOSIS — Z3403 Encounter for supervision of normal first pregnancy, third trimester: Secondary | ICD-10-CM

## 2013-04-07 NOTE — Patient Instructions (Signed)
Ch?y Mu m ?o [address] [place], K? Ba Thng Cu?i (Vaginal Bleeding During Pregnancy, Third Trimester)  M?t l?ng nh? ch?y mu (?m) l tng ?i ph? bi?n trong [place] k?Marland Kitchen i khi ch?y mu c th? l "bnh th?ng". Ch?y mu trong k? ba thng cu?i c th? r?t nghim tr?ng cho b m? v em b, v c?n ph?i ?c bo co ngay cho chuyn gia chm [place] y t?. i?u r?t quan tr?ng l lm theo h?ng d?n c?a chuyn gia chm [place] y t?. NGUYN NHN  Nhau thai c th? che m?t ph?n ho?c ton b? khe c? t? cung (nhau ti?n ?o).  Nhau [place] c th?  tch ra kh?i t? cung (bong nhau [place]).  C th? c s? nhi?m trng ho?c pht tri?n trn c? t? cung.  B?n c th? b?t ?u au ?, ?c g?i l x? ngh?t canule.  Nhau thai c th? pht tri?n vo l?p c c?a t? cung (nhau ci rng l?c). CH?N ON Chuyn gia chm [place] y t? c th? lm:  Khm vng ch?u trong phng sinh, ?c g?i thi?t l?p p (chu?n b? s?n sng cho m? ? kh?n c?p, n?u c?n).  Xt nghi?m mu ? xem b?n c b? thi?u mu khng (khng c ? h?ng huy?t c?u).  Siu m v theo di thai nhi, ? xem em b c v?n ? g khng.  Siu m, ? tm hi?u l do t?i sao b?n ch?y mu. I?U TR?  B?n c th? c?n ? l?i b?nh vi?n.  B?n c th? ?c ?t ?ng IV (trong t?nh m?ch) trong khi ? trong b?nh vi?n.  B?n c th? ?c a v? ngh? ngi t?i gi?ng nghim ng?t.  B?n c th? c?n truy?n mu.  B?n c th? ?c cho th? -xy.  Em b c th? c?n ?c sinh ngay l?p t?c. H?NG D?N [facility] [place] T?I NH  Chuyn gia chm [place] y t? c th? yu c?u b?n ngh? ngi t?i gi?ng (ch? d?y ? vo phng v? sinh). T?i th?i i?m ny, b?n c th? c?n ph?i s?p x?p cho vi?c chm [place] con v cc trch nhi?m no khc.  Theo di s? l?ng bng v? sinh b?n s? d?ng m?i ngy v m?c ? ng?m c?a chng. Ghi l?i ch? d?n ny.  Khng s? d?ng bng v? sinh. Khng th?t r?a m ?o.  Khng quan h? tnh d?c ho?c c b?t k? ho?t ?ng tnh d?c no c th? gy c?c khoi, cho ?n khi ?c ch?p thu?n b?i chuyn gia chm [place] y t?.  Th?c  hi?n theo l?i khuyn c?a chuyn gia chm [place] y t? v? vi?c nng, li xe, ho?t ?ng th? ch?t v x h?i.  p d?ng ch? ? n u?ng dinh d?ng v cn b?ng.  Ngh? ngi v ng? th?t nhi?u.  Khng u?ng r?u hay ht thu?c l. HY NGAY L?P T?C THAM V?N V?I CHUYN GIA Y T? N?U:  B?n b? chu?t rt n?ng ho?c au n?ng ? lng ho?c b?ng.  Nhi?t ? mi?ng c?a b?n trn 102 F (38.9 C), khng ?c ki?m sot b?i thu?c.  B?n pht tri?n ?n l?nh.  B?n phun ch?t l?ng ra t? m ?o.  B?n cho ra c?c mu ng l?n ho?c m. Gi? l?i m?i m ? chuyn gia chm [place] y t? c?a b?n ki?m tra.  B?n b? ch?y mu gia tng ho?c b?n mu?n ng?t ho?c y?u.  B?n b? ng?t.  B?n c?m th?y em b chuy?n ?ng t hn ho?c  khng c chuy?n ?ng. Document Released: 12/01/2005 Document Revised: 02/23/2012 [facility] Patient Information 2013 [place], [place].

## 2013-04-07 NOTE — Progress Notes (Signed)
Elizabeth Foley is here for her scheduled [redacted]w[redacted]d appointment. She has no complaints. She is feeling good. She has not experienced leaking, gush or bleeding. She has felt no contractions, but is starting to feel a little pressure.She again presents to the clinic without an interpreter, although it was asked for her to have one. She speaks vietnamese (regular) her husband speaks decent [place], but his native language is Counselling psychologist.  O: BP 110/67  Temp(Src) 97.9 F (36.6 C)  Wt 138 lb 8 oz (62.823 kg)  BMI 23.76 kg/m2  LMP 08/30/2012 Baby has been breech. However today she feels as if the baby may be in a transverse position.  A/P: - Baby girl via [place]: Sammuel Bailiff - Patient is to be seen in [facility] clinic, this appointment was made for them since their has been some confusion once they leave the room.  - Patient MUST start to have an interpreter come with her. I have questions to ask her personally, that I want to make sure she is answering for herself. I also want to discuss breastfeeding with her and make sure she understands to go to MAU etc. Instructions for her to call [facility] for this to be set up have been given.  - next appt: Obtain third trimester blood work. -

## 2013-04-28 ENCOUNTER — Ambulatory Visit (INDEPENDENT_AMBULATORY_CARE_PROVIDER_SITE_OTHER): Payer: Medicaid Other | Admitting: Family Medicine

## 2013-04-28 VITALS — BP 112/69 | Temp 98.4°F | Wt 143.6 lb

## 2013-04-28 DIAGNOSIS — Z3403 Encounter for supervision of normal first pregnancy, third trimester: Secondary | ICD-10-CM

## 2013-04-28 DIAGNOSIS — Z3400 Encounter for supervision of normal first pregnancy, unspecified trimester: Secondary | ICD-10-CM

## 2013-04-28 LAB — CBC WITH DIFFERENTIAL/PLATELET
Basophils Relative: 0 % (ref 0–1)
Eosinophils Absolute: 0.1 10*3/uL (ref 0.0–0.7)
HCT: 32.9 % — ABNORMAL LOW (ref 36.0–46.0)
Hemoglobin: 11 g/dL — ABNORMAL LOW (ref 12.0–15.0)
MCH: 27.2 pg (ref 26.0–34.0)
MCHC: 33.4 g/dL (ref 30.0–36.0)
MCV: 81.4 fL (ref 78.0–100.0)
Monocytes Absolute: 0.5 10*3/uL (ref 0.1–1.0)
Monocytes Relative: 8 % (ref 3–12)

## 2013-04-28 NOTE — Patient Instructions (Signed)
Preventing Preterm Labor Preterm labor is when a pregnant woman has contractions that cause the cervix to open, shorten, and thin before 37 weeks of pregnancy. You will have regular contractions (tightening) 2 to 3 minutes apart. This usually causes discomfort or pain. HOME CARE  Eat a healthy diet.  Take your vitamins as told by your doctor.  Drink enough fluids to keep your pee (urine) clear or pale yellow every day.  Get rest and sleep.  Do not have sex if you are at high risk for preterm labor.  Follow your doctor's advice about activity, medicines, and tests.  Avoid stress.  Avoid hard labor or exercise that lasts for a long time.  Do not smoke. GET HELP RIGHT AWAY IF:   You are having contractions.  You have belly (abdominal) pain.  You have bleeding from your vagina.  You have pain when you pee (urinate).  You have abnormal discharge from your vagina.  You have a temperature by mouth above 102 F (38.9 C). MAKE SURE YOU:  Understand these instructions.  Will watch your condition.  Will get help if you are not doing well or get worse. Document Released: 02/27/2009 Document Revised: 02/23/2012 Document Reviewed: 02/27/2009 ExitCare Patient Information 2013 [employer].  

## 2013-04-28 NOTE — Progress Notes (Signed)
27 y/O Female at 42 w 3 days GA here for routine prenatal care,here with her husband,denies any concern,interpreter telephone line used for [employer]. She denies any feeling of depression,denies any form of abuse,feels safe at home,denies any abdominal pain,no vaginal discharge,denies uterine contraction.  O/E: Resp and CV: wnl. Check flow sheet for other exam.  A/P: 27 y/o at 34 wks 3 days for routine prenatal care,normal pregnancy.        3 rd trimester lab done today includig CBC,HIV and RPR.        Taught on preterm labor.        Return in 2 wks for GBS with PMD.

## 2013-04-29 ENCOUNTER — Encounter: Payer: Self-pay | Admitting: Family Medicine

## 2013-05-20 ENCOUNTER — Ambulatory Visit (INDEPENDENT_AMBULATORY_CARE_PROVIDER_SITE_OTHER): Payer: Medicaid Other | Admitting: Family Medicine

## 2013-05-20 VITALS — BP 109/72 | Temp 98.0°F | Wt 146.0 lb

## 2013-05-20 DIAGNOSIS — Z3400 Encounter for supervision of normal first pregnancy, unspecified trimester: Secondary | ICD-10-CM

## 2013-05-20 DIAGNOSIS — Z3402 Encounter for supervision of normal first pregnancy, second trimester: Secondary | ICD-10-CM

## 2013-05-20 NOTE — Patient Instructions (Addendum)

## 2013-05-20 NOTE — Progress Notes (Signed)
Oluwatomisin Hustead is 27 y.o. G1P0 @ [redacted]w[redacted]d here for her routine prenatal appointment. She is present with her husband, who translates for me. She has no complaints. Denies leaking, gush of fluid, vaginal bleeding or regular contractions. She feels her baby move frequently throughout the day.  O: BP 109/72  Temp(Src) 98 F (36.7 C)  Wt 146 lb (66.225 kg)  BMI 25.05 kg/m2  LMP 08/30/2012 ABD: Gravid. Uncertain of fetal position.  A/P:  - Female baby - A positive - Uncertain fetal position/prior breech presentation on previous US; Attempted in office [place], with Dr.Enoila, was able to certainly define position. [place] orderedat [facility] (earliest availably was June 11th) - GBS culture collected in office today - fetal kick counts, and labor precautions explained.  - F/U: 1 week

## 2013-05-25 ENCOUNTER — Other Ambulatory Visit: Payer: Self-pay | Admitting: Family Medicine

## 2013-05-25 ENCOUNTER — Ambulatory Visit (HOSPITAL_COMMUNITY)
Admission: RE | Admit: 2013-05-25 | Discharge: 2013-05-25 | Disposition: A | Discharge status: Home / Self Care | Payer: Medicaid Other | Source: Ambulatory Visit | Attending: Family Medicine | Admitting: Family Medicine

## 2013-05-25 DIAGNOSIS — Z3402 Encounter for supervision of normal first pregnancy, second trimester: Secondary | ICD-10-CM

## 2013-05-25 DIAGNOSIS — Z1389 Encounter for screening for other disorder: Secondary | ICD-10-CM | POA: Insufficient documentation

## 2013-05-25 DIAGNOSIS — Z363 Encounter for antenatal screening for malformations: Secondary | ICD-10-CM | POA: Insufficient documentation

## 2013-05-27 ENCOUNTER — Encounter: Payer: Medicaid Other | Admitting: Family Medicine

## 2013-05-27 ENCOUNTER — Ambulatory Visit (INDEPENDENT_AMBULATORY_CARE_PROVIDER_SITE_OTHER): Payer: Medicaid Other | Admitting: Family Medicine

## 2013-05-27 VITALS — BP 110/70 | Wt 148.6 lb

## 2013-05-27 DIAGNOSIS — Z3403 Encounter for supervision of normal first pregnancy, third trimester: Secondary | ICD-10-CM

## 2013-05-27 DIAGNOSIS — Z3400 Encounter for supervision of normal first pregnancy, unspecified trimester: Secondary | ICD-10-CM

## 2013-05-27 NOTE — Patient Instructions (Signed)
Please go to the hospital if you have 5 contractions in one hour or your water breaks. Follow up in 1 week if you have not had the baby.   Dr. Clinton Sawyer

## 2013-05-27 NOTE — Progress Notes (Signed)
27 year old G1P1 @ [redacted]w[redacted]d EGA for routine prenatal. Recent ultrasound should cephalic presentation.  Patient presents with her husband. [place] interpreter used.  - no complaints, no contractions or LOP O: See flow sheet A/P: 27 year old G1P1 @ [redacted]w[redacted]d EGA  - Plan for NSVD - Patient would like an epidural - Plans to breast feed - GBS negative - Given indication for going to MAU - Return in 1 week if not delivered

## 2013-06-02 ENCOUNTER — Encounter (HOSPITAL_COMMUNITY): Payer: Self-pay | Admitting: *Deleted

## 2013-06-02 ENCOUNTER — Telehealth: Payer: Self-pay | Admitting: Family Medicine

## 2013-06-02 ENCOUNTER — Ambulatory Visit (INDEPENDENT_AMBULATORY_CARE_PROVIDER_SITE_OTHER): Payer: Medicaid Other | Admitting: Family Medicine

## 2013-06-02 ENCOUNTER — Inpatient Hospital Stay (HOSPITAL_COMMUNITY)
Admission: AD | Admit: 2013-06-02 | Discharge: 2013-06-02 | Disposition: A | Discharge status: Home / Self Care | Payer: Medicaid Other | Source: Ambulatory Visit | Attending: Obstetrics and Gynecology | Admitting: Obstetrics and Gynecology

## 2013-06-02 VITALS — BP 107/70 | Temp 98.1°F | Wt 148.0 lb

## 2013-06-02 DIAGNOSIS — O99891 Other specified diseases and conditions complicating pregnancy: Secondary | ICD-10-CM | POA: Insufficient documentation

## 2013-06-02 DIAGNOSIS — Z3403 Encounter for supervision of normal first pregnancy, third trimester: Secondary | ICD-10-CM

## 2013-06-02 DIAGNOSIS — Z3400 Encounter for supervision of normal first pregnancy, unspecified trimester: Secondary | ICD-10-CM

## 2013-06-02 DIAGNOSIS — O479 False labor, unspecified: Secondary | ICD-10-CM | POA: Insufficient documentation

## 2013-06-02 DIAGNOSIS — N949 Unspecified condition associated with female genital organs and menstrual cycle: Secondary | ICD-10-CM | POA: Insufficient documentation

## 2013-06-02 LAB — WET PREP, GENITAL
Clue Cells Wet Prep HPF POC: NONE SEEN
Trich, Wet Prep: NONE SEEN

## 2013-06-02 NOTE — Progress Notes (Signed)
Elizabeth Foley is a 27 y.o. G1P0 [redacted]w[redacted]d she was at the MAU today for possible rupture of membranes. She is tired more so than normal. She and her husband desire induction at 41 weeks if she is still pregnant. Her membranes were not ruptured and her wet prep was normal.   O: BP 107/70  Temp(Src) 98.1 F (36.7 C)  Wt 148 lb (67.132 kg)  BMI 25.39 kg/m2  LMP 08/30/2012 Abd: Gravid  A/P:  - BF - GBS negative - Desires epidural  - MAU indications, bleeding and  Labor precautions.  - Will set up 41 week induction and NST/BPP for next week.  - I can be paged for delivery [phone], I may be out of town, if so please call Dr. Clinton Sawyer Zenaida Niece) for delivery - NST and BPP scheduled for next week - F/U: 1 week with Clinton Sawyer if still pregnant

## 2013-06-02 NOTE — MAU Note (Signed)
?  srom @ 0200 this AM;

## 2013-06-02 NOTE — Patient Instructions (Signed)
Labor Induction  Most women go into labor on their own between 37 and 42 weeks of the pregnancy. When this does not happen or when there is a medical need, medicine or other methods may be used to induce labor. Labor induction causes a pregnant woman's uterus to contract. It also causes the cervix to soften (ripen), open (dilate), and thin out (efface). Usually, labor is not induced before 39 weeks of the pregnancy unless there is a problem with the baby or mother. Whether your labor will be induced depends on a number of factors, including the following:  The medical condition of you and the baby.  How many weeks along you are.  The status of baby's lung maturity.  The condition of the cervix.  The position of the baby. REASONS FOR LABOR INDUCTION  The health of the baby or mother is at risk.  The pregnancy is overdue by 1 week or more.  The water breaks but labor does not start on its own.  The mother has a health condition or serious illness such as high blood pressure, infection, placental abruption, or diabetes.  The amniotic fluid amounts are low around the baby.  The baby is distressed. REASONS TO NOT INDUCE LABOR Labor induction may not be a good idea if:  It is shown that your baby does not tolerate labor.  An induction is just more convenient.  You want the baby to be born on a certain date, like a holiday.  You have had previous surgeries on your uterus, such as a myomectomy or the removal of fibroids.  Your placenta lies very low in the uterus and blocks the opening of the cervix (placenta previa).  Your baby is not in a head down position.  The umbilical cord drops down into the birth canal in front of the baby. This could cut off the baby's blood and oxygen supply.  You have had a previous cesarean delivery.  There areunusual circumstances, such as the baby being extremely premature. RISKS AND COMPLICATIONS Problems may occur in the process of induction  and plans may need to be modified as a situation unfolds. Some of the risks of induction include:  Change in fetal heart rate, such as too high, too low, or erratic.  Risk of fetal distress.  Risk of infection to mother and baby.  Increased chance of having a cesarean delivery.  The rare, but increased chance that the placenta will separate from the uterus (abruption).  Uterine rupture (very rare). When induction is needed for medical reasons, the benefits of induction may outweigh the risks. BEFORE THE PROCEDURE Your caregiver will check your cervix and the baby's position. This will help your caregiver decide if you are far enough along for an induction to work. PROCEDURE Several methods of labor induction may be used, such as:   Taking prostaglandin medicine to dilate and ripen the cervix. The medicine will also start contractions. It can be taken by mouth or by inserting a suppository into the vagina.  A thin tube (catheter) with a balloon on the end may be inserted into your vagina to dilate the cervix. Once inserted, the balloon expands with water, which causes the cervix to open.  Striping the membranes. Your caregiver inserts a finger between the cervix and membranes, which causes the cervix to be stretched and may cause the uterus to contract. This is often done during an office visit. You will be sent home to wait for the contractions to begin. You will   then come in for an induction.  Breaking the water. Your caregiver will make a hole in the amniotic sac using a small instrument. Once the amniotic sac breaks, contractions should begin. This may still take hours to see an effect.  Taking medicine to trigger or strengthen contractions. This medicine is given intravenously through a tube in your arm. All of the methods of induction, besides stripping the membranes, will be done in the hospital. Induction is done in the hospital so that you and the baby can be carefully  monitored. AFTER THE PROCEDURE Some inductions can take up to 2 or 3 days. Depending on the cervix, it usually takes less time. It takes longer when you are induced early in the pregnancy or if this is your first pregnancy. If a mother is still pregnant and the induction has been going on for 2 to 3 days, either the mother will be sent home or a cesarean delivery will be needed. Document Released: 04/22/2007 Document Revised: 02/23/2012 Document Reviewed: 10/06/2011 [facility] Patient Information 2014 [place], [place]. Fetal Monitoring, Nonstress Test The nonstress test (NST) is a procedure that monitors the increase of the fetal heart rate in response to the fetal movement. An increase in the fetal heart rate during fetal movement shows that the pregnancy is normal. The increase also shows the well-being of the baby's central nervous system. Fetal activity may be spontaneous, induced a by uterine contraction, or induced by external stimulation with an artificial voice box. Oxytocin stimulation is not used in this procedure. This test is also done to see if there are problems with the pregnancy and the baby. Identifying and correcting problems may prevent serious problems from developing with the fetus, including fetal loss.  OTHER TECHNIQUES OF MONITORING YOUR BABY PRIOR TO BIRTH:  Fetal movement assessment. This is done by the pregnant woman herself by counting and recording the baby's movements over a certain period of theme.  Contraction stress test (CST). This test monitors the baby's heart rate during a contraction of the uterus.  Fetal biophysical profile (BPP). This measures and evaluates 5 observations of the baby:  The nonstress test.  The baby's breathing.  The baby's movements.  The baby's muscle tone.  The amount of fluid in the amniotic sac.  Modified biophysical profile. This measures the volume of fluid in different parts of the amniotic sac (amniotic fluid index) and the  results of the nonstress test.  Umbilical artery doppler velocimetry. This evaluates the blood flow through the umbilical cord. There are several very serious problems that cannot be predicted or detected with any of the fetal monitoring procedures. These problems include separation (abruption) of the placenta and choking of the baby with the umbilical cord (umbilical cord accident). Your caregiver will help you understand the tests and what they mean for you and your baby. It is your responsibility to obtain the results of your test. LET YOUR CAREGIVER KNOW ABOUT:  Any medications you are taking including prescription and over-the-counter drugs, herbs, eye drops and creams.  If you have a fever.  If you have an infection.  If you are sick. RISKS AND COMPLICATIONS  There are no risks or complications to the mother or fetus with this test. BEFORE THE PROCEDURE  Do not take medications that may increase or decrease the baby's heart rate and/or movements.  Have a full meal at least 2 hours before the test.  Do not smoke if you are pregnant. If you smoke, stop at least 2 days  before the test. It is a good idea not to smoke at all when you are pregnant. PROCEDURE The NST is based on the idea that the heart rate of a normal baby will speed up while the baby is moving around. This is an indicator of a normal working pregnancy. Loss of movement is seen commonly while the baby is sleeping or if there are problems in the pregnancy. Smoking may hurt test results.  With the patient lying on her side, the fetal heart rate is checked with an electrode on the belly.  The line drawn from a recording instrument (tracing) is observed for fetal heart rate accelerations that speed up at least 15 beats per minute above resting, and last 15 seconds. Tracings of 40 minutes or longer may be necessary.  Sound stimulation of the healthy fetus may speed up a baby's heart. This also means the baby is healthy.  Stimulation helps to reduce testing time and helps find problems in the pregnancy if there is any. To do this, an artificial voice box is put on the mother's belly for about 12 seconds. This may be repeated up to 3 times for progressively longer durations.  Results are categorized as normal (reactive) or abnormal (nonreactive). Commonly, the NST is considered normal (reactive) if there are 2 or more fetal heart rate accelerations as described inside a 20-minute period. This is with or without fetal movement the mother feels. A nonreactive NST is one that does not have enough fetal heart rate accelerations over a 40 minute period.  Abnormal testing may need further testing.  Your caregiver will help you understand your tests and what they mean for you and your baby. It should be noted also that:  The NST can be nonreactive 50% of the time in weeks 24 to 28 of a normal pregnancy.  The NST can be nonreactive 15% of the time in weeks 28 to 32 of a normal pregnancy.  Lower fetal heart rate (decelerations) may be seen in 50% of NST's, but if they are consistent (3 in 20 minutes), it increases the risk for Cesarean section.  Decelerations of the fetal heart rate that last for one minute or longer indicates a very serious problem with the baby and a Cesarean section may be needed right away. AFTER THE PROCEDURE  You may go home and resume your usual activities, or as directed by your caregiver. HOME CARE INSTRUCTIONS   Follow your caregiver's advice and recommendations.  Beware of your baby's movements. Are they normal, less than usual or more than usual?  Make and keep the rest of your prenatal appointments. SEEK MEDICAL CARE IF:   You develop a temperature of 100 F (37.9 C) or higher.  You have a bloody mucus discharge from the vagina (a bloody show). SEEK IMMEDIATE MEDICAL CARE IF:   You do not feel the baby move.  You think the baby's movements are less than usual or more than  usual.  You develop contractions.  You develop vaginal bleeding.  You develop belly (abdominal) pain.  You have leaking or a gush of fluid from the vagina. Document Released: 11/21/2002 Document Revised: 02/23/2012 Document Reviewed: 03/27/2009 [facility] Patient Information 2014 [place], [place]. Fetal Biophysical Profile This is a test that measures five different variables of the fetus: Heart rate, breathing movement, total movement of the baby, fetal muscle tone, the amount of amniotic fluid, and the heart rate activity of the fetus. The five variables are measured individually and contribute either a  2 or a 0 to the overall scoring of the test. The measurements are as follows:  Fetal heart rate activity. This is measured and scored in the same way as a non-stress test. The fetal heart rate is considered reactive when there are movement-associated fetal heart rate increases of at least 15 beats per minute above baseline, and 15 seconds in duration over a 20-minute period. A score of 2 is given for reactivity, and a score of 0 indicates that the fetal heart rate is non-reactive.  Fetal breathing movements. This is scored based on fetal breathing movements and indicate fetal well-being. Their absence may indicate a low oxygen level for the fetus. Fetal breathing increases in frequency and uniformity after the 36th week of pregnancy. To earn a score of 2, the fetus must have at least one episode of fetal breathing lasting at least 60 seconds within a 30-minute observation. Absence of this breathing is scored a 0 on the BPP.  Fetal body movements. Fetal activity is a reflection of brain integrity and function. The presence of at least three episodes of fetal movements within a 30-minute period is given a score of 2. A score of 0 is given with two or less movements in this time period. Fetal activity is highest 1 to 3 hours after the mother has eaten a meal.  Fetal tone. In the uterus, the fetus is  normally in a position of flexion. This means the head is bent down towards the knees. The fetus also stretches, rolls, and moves in the uterus. The arms, legs, trunk, and head may be flexed and extended. A score of 2 is earned when there is at least one episode of active extension with return flexion. A score of 0 is given for slow extension with a return to only partial flexion. Fetal movement not followed by return to flexion, limbs or spine in extension, and an open fetal hand score 0.  Amniotic fluid volume. Amniotic fluid volume has been demonstrated to be a good method of predicting fetal distress. Too little amniotic fluid has been associated with fetal abnormalities, slow uterine growth, and over due pregnancy. A score of 2 is given for this when there is at least one pocket of amniotic fluid that measures 1 cm in a specific area. A score of 0 indicates either that fluid is absent in most areas of the uterine cavity or that the largest pocket of fluid measures less than 1 cm. PREPARATION FOR TEST No preparation or fasting is necessary. NORMAL FINDINGS  A score of 8-10 points (if amniotic fluid volume is adequate).  Possible critical values: Less than 4 may necessitate immediate delivery of fetus. Ranges for normal findings may vary among different laboratories and hospitals. You should always check with your doctor after having lab work or other tests done to discuss the meaning of your test results and whether your values are considered within normal limits. MEANING OF TEST  Your caregiver will go over the test results with you and discuss the importance and meaning of your results, as well as treatment options and the need for additional tests if necessary. OBTAINING THE TEST RESULTS  It is your responsibility to obtain your test results. Ask the lab or department performing the test when and how you will get your results. Document Released: 04/03/2005 Document Revised: 02/23/2012 Document  Reviewed: 11/10/2008 [facility] Patient Information 2014 [place], [place].

## 2013-06-02 NOTE — MAU Provider Note (Signed)
History     CSN: [id number]  Arrival date and time: 06/02/13 1118   First Provider Initiated Contact with Patient 06/02/13 1220      Chief Complaint  Patient presents with  . Rupture of Membranes   HPI Elizabeth Foley is a 27 y.o. G1P0 at [redacted]w[redacted]d who presents with complaints of leaking fluid.  Patient accompanied by husband who is translating (patient is [place] and speaks poor english). Patient reports that she noticed leaking of fluid at 2:00 am this morning.  Patient reports small amount of fluid.  She also reports that she noticed a small amount of blood when wiping this morning and some occasional contractions.  No other complaints at this time.  *Obtains [facility] family practice (Dr. Claiborne Billings)  Past Medical History  Diagnosis Date  . No pertinent past medical history   . Medical history non-contributory     Past Surgical History  Procedure Laterality Date  . No past surgeries      Family History  Problem Relation Age of Onset  . Alcohol abuse Neg Hx   . Arthritis Neg Hx   . Asthma Neg Hx   . Cancer Neg Hx   . Birth defects Neg Hx   . COPD Neg Hx   . Depression Neg Hx   . Diabetes Neg Hx   . Drug abuse Neg Hx   . Early death Neg Hx   . Hearing loss Neg Hx   . Heart disease Neg Hx   . Hyperlipidemia Neg Hx   . Hypertension Neg Hx   . Kidney disease Neg Hx   . Learning disabilities Neg Hx   . Mental illness Neg Hx   . Mental retardation Neg Hx   . Miscarriages / Stillbirths Neg Hx   . Stroke Neg Hx   . Vision loss Neg Hx     History  Substance Use Topics  . Smoking status: Never Smoker   . Smokeless tobacco: Never Used  . Alcohol Use: No    Allergies: No Known Allergies  Prescriptions prior to admission  Medication Sig Dispense Refill  . Prenatal Vit-Fe Fumarate-FA (PRENATAL MULTIVITAMIN) TABS Take 1 tablet by mouth daily at 12 noon.        ROS Per HPI Physical Exam   Blood pressure 115/87, pulse 111, temperature 97.9 F (36.6 C), temperature  source Oral, resp. rate 18, height 5\' 4"  (1.626 m), weight 67.132 kg (148 lb), last menstrual period 08/30/2012.  Physical Exam Gen: well appearing, flat affect, NAD. Heart: RRR. No murmurs noted. Lungs: CTAB.  Abd: gravid but otherwise soft, nontender to palpation Ext: no appreciable lower extremity edema bilaterally Neuro: no focal deficits. GU: normal appearing external genitalia Pelvic exam: Cervix appears closed.  No bleeding or pooling noted.  Cervical exam: Dilation: Closed Effacement (%): 50 Station: -3 Exam by:: [employer] RN  FHR: baseline 140, mod variability, 15x15 accels, no decels Toco: 1-6+ irregular  MAU Course  Procedures  MDM Wet prep, Fern  Assessment and Plan  Elizabeth Foley is a 27 y.o. G1P0 at [redacted]w[redacted]d who presents with concern for ROM. - No pooling noted, Fern negative. - Cat 1 FH tracing - Obtaining wet prep given thick white discharge seen on examination.  1240 PM - Spoke with PCP. Patient to follow up today (regularly scheduled appointment). PCP to follow up on results of wet prep.    Everlene Other 06/02/2013, 12:21 PM   I examined pt and agree with documentation above and resident plan  of care. [facility]

## 2013-06-02 NOTE — Telephone Encounter (Signed)
Pt was seen today 06/02/13, she needed to make an apt with Dr. Clinton Sawyer per [facility] for the following week but Dr.Williamson was either booked or was not here next week pt is a ob pt and needs to be called concerning an apt for the week of 6/23.

## 2013-06-03 ENCOUNTER — Telehealth (HOSPITAL_COMMUNITY): Payer: Self-pay | Admitting: *Deleted

## 2013-06-03 ENCOUNTER — Telehealth: Payer: Self-pay | Admitting: Family Medicine

## 2013-06-03 NOTE — Telephone Encounter (Signed)
Called pt and was able to schedule next apt on 06/10/13 @ 3:15 with Dr. Clinton Sawyer per Dr. Claiborne Billings

## 2013-06-03 NOTE — Telephone Encounter (Signed)
Preadmission screen Interpreter number [phone]

## 2013-06-03 NOTE — Telephone Encounter (Signed)
You may schedule her anywhere Thursday or Friday, the preferred provider was Dr. Clinton Sawyer or a female provider that needed a continuity delivery. She may deliver by then, but we should see her once if she is not, and  I am on vacation.  She is scheduled for NST and BPP next week as well. She is scheduled for induction on June 30th if she is still pregnant.

## 2013-06-03 NOTE — Telephone Encounter (Signed)
Will fwd message from Dr. Claiborne Billings to Archie Patten, in registration, to call patient and schedule according to Dr. Alan Ripper note.  Gabrille Kilbride, Darlyne Russian, CMA

## 2013-06-03 NOTE — Telephone Encounter (Signed)
Will fwd to Dr. Claiborne Billings and Dr. Clinton Sawyer for advice on who and when to schedule patient.  Since schedule is booked, please advise where it is ok to schedule patient.  Thank you.  Jacquis Paxton, Darlyne Russian, CMA

## 2013-06-04 ENCOUNTER — Encounter (HOSPITAL_COMMUNITY): Payer: Self-pay | Admitting: Anesthesiology

## 2013-06-04 ENCOUNTER — Encounter (HOSPITAL_COMMUNITY): Payer: Self-pay | Admitting: *Deleted

## 2013-06-04 ENCOUNTER — Inpatient Hospital Stay (HOSPITAL_COMMUNITY)
Admission: AD | Admit: 2013-06-04 | Discharge: 2013-06-06 | DRG: 775 | Disposition: A | Discharge status: Home / Self Care | Payer: Medicaid Other | Source: Ambulatory Visit | Attending: Obstetrics & Gynecology | Admitting: Obstetrics & Gynecology

## 2013-06-04 ENCOUNTER — Inpatient Hospital Stay (HOSPITAL_COMMUNITY): Payer: Medicaid Other | Admitting: Anesthesiology

## 2013-06-04 DIAGNOSIS — IMO0001 Reserved for inherently not codable concepts without codable children: Secondary | ICD-10-CM

## 2013-06-04 LAB — CBC
MCH: 26.5 pg (ref 26.0–34.0)
MCHC: 32.7 g/dL (ref 30.0–36.0)
RDW: 14.4 % (ref 11.5–15.5)

## 2013-06-04 LAB — GLUCOSE, CAPILLARY: Glucose-Capillary: 100 mg/dL — ABNORMAL HIGH (ref 70–99)

## 2013-06-04 LAB — TYPE AND SCREEN
ABO/RH(D): O POS
Antibody Screen: NEGATIVE

## 2013-06-04 MED ORDER — LACTATED RINGERS IV SOLN
500.0000 mL | Freq: Once | INTRAVENOUS | Status: AC
  Administered 2013-06-04: 500 mL via INTRAVENOUS

## 2013-06-04 MED ORDER — LACTATED RINGERS IV SOLN: 500.0000 mL | INTRAVENOUS | Status: DC | PRN

## 2013-06-04 MED ORDER — LIDOCAINE HCL (PF) 1 % IJ SOLN
30.0000 mL | INTRAMUSCULAR | Status: DC | PRN
  Filled 2013-06-04 (×2): qty 30

## 2013-06-04 MED ORDER — OXYTOCIN BOLUS FROM INFUSION
500.0000 mL | INTRAVENOUS | Status: DC
  Administered 2013-06-04: 500 mL via INTRAVENOUS

## 2013-06-04 MED ORDER — PHENYLEPHRINE 40 MCG/ML (10ML) SYRINGE FOR IV PUSH (FOR BLOOD PRESSURE SUPPORT)
80.0000 ug | PREFILLED_SYRINGE | INTRAVENOUS | Status: DC | PRN
  Filled 2013-06-04: qty 2
  Filled 2013-06-04: qty 5

## 2013-06-04 MED ORDER — ACETAMINOPHEN 325 MG PO TABS: 650.0000 mg | ORAL_TABLET | ORAL | Status: DC | PRN

## 2013-06-04 MED ORDER — PRENATAL MULTIVITAMIN CH
1.0000 | ORAL_TABLET | Freq: Every day | ORAL | Status: DC
  Administered 2013-06-05 – 2013-06-06 (×2): 1 via ORAL
  Filled 2013-06-04 (×2): qty 1

## 2013-06-04 MED ORDER — OXYCODONE-ACETAMINOPHEN 5-325 MG PO TABS
1.0000 | ORAL_TABLET | ORAL | Status: DC | PRN
  Administered 2013-06-05: 1 via ORAL
  Filled 2013-06-04: qty 1

## 2013-06-04 MED ORDER — IBUPROFEN 600 MG PO TABS
600.0000 mg | ORAL_TABLET | Freq: Four times a day (QID) | ORAL | Status: DC
  Administered 2013-06-05 – 2013-06-06 (×8): 600 mg via ORAL
  Filled 2013-06-04 (×8): qty 1

## 2013-06-04 MED ORDER — METHYLERGONOVINE MALEATE 0.2 MG PO TABS: 0.2000 mg | ORAL_TABLET | ORAL | Status: DC | PRN

## 2013-06-04 MED ORDER — FENTANYL CITRATE 0.05 MG/ML IJ SOLN
100.0000 ug | INTRAMUSCULAR | Status: DC | PRN
  Administered 2013-06-04: 100 ug via INTRAVENOUS
  Filled 2013-06-04: qty 2

## 2013-06-04 MED ORDER — OXYCODONE-ACETAMINOPHEN 5-325 MG PO TABS: 1.0000 | ORAL_TABLET | ORAL | Status: DC | PRN

## 2013-06-04 MED ORDER — SENNOSIDES-DOCUSATE SODIUM 8.6-50 MG PO TABS
2.0000 | ORAL_TABLET | Freq: Every day | ORAL | Status: DC
  Administered 2013-06-05 (×2): 2 via ORAL

## 2013-06-04 MED ORDER — BENZOCAINE-MENTHOL 20-0.5 % EX AERO: 1.0000 "application " | INHALATION_SPRAY | CUTANEOUS | Status: DC | PRN

## 2013-06-04 MED ORDER — ONDANSETRON HCL 4 MG/2ML IJ SOLN: 4.0000 mg | INTRAMUSCULAR | Status: DC | PRN

## 2013-06-04 MED ORDER — DIBUCAINE 1 % RE OINT: 1.0000 "application " | TOPICAL_OINTMENT | RECTAL | Status: DC | PRN

## 2013-06-04 MED ORDER — IBUPROFEN 600 MG PO TABS: 600.0000 mg | ORAL_TABLET | Freq: Four times a day (QID) | ORAL | Status: DC | PRN

## 2013-06-04 MED ORDER — CITRIC ACID-SODIUM CITRATE 334-500 MG/5ML PO SOLN: 30.0000 mL | ORAL | Status: DC | PRN

## 2013-06-04 MED ORDER — METHYLERGONOVINE MALEATE 0.2 MG/ML IJ SOLN: 0.2000 mg | INTRAMUSCULAR | Status: DC | PRN

## 2013-06-04 MED ORDER — PHENYLEPHRINE 40 MCG/ML (10ML) SYRINGE FOR IV PUSH (FOR BLOOD PRESSURE SUPPORT)
80.0000 ug | PREFILLED_SYRINGE | INTRAVENOUS | Status: DC | PRN
  Filled 2013-06-04: qty 2

## 2013-06-04 MED ORDER — EPHEDRINE 5 MG/ML INJ
10.0000 mg | INTRAVENOUS | Status: DC | PRN
  Filled 2013-06-04: qty 4
  Filled 2013-06-04: qty 2

## 2013-06-04 MED ORDER — SIMETHICONE 80 MG PO CHEW: 80.0000 mg | CHEWABLE_TABLET | ORAL | Status: DC | PRN

## 2013-06-04 MED ORDER — OXYTOCIN 40 UNITS IN LACTATED RINGERS INFUSION - SIMPLE MED: 62.5000 mL/h | INTRAVENOUS | Status: DC

## 2013-06-04 MED ORDER — ONDANSETRON HCL 4 MG/2ML IJ SOLN: 4.0000 mg | Freq: Four times a day (QID) | INTRAMUSCULAR | Status: DC | PRN

## 2013-06-04 MED ORDER — FLEET ENEMA 7-19 GM/118ML RE ENEM: 1.0000 | ENEMA | RECTAL | Status: DC | PRN

## 2013-06-04 MED ORDER — ONDANSETRON HCL 4 MG PO TABS: 4.0000 mg | ORAL_TABLET | ORAL | Status: DC | PRN

## 2013-06-04 MED ORDER — LANOLIN HYDROUS EX OINT: TOPICAL_OINTMENT | CUTANEOUS | Status: DC | PRN

## 2013-06-04 MED ORDER — WITCH HAZEL-GLYCERIN EX PADS: 1.0000 "application " | MEDICATED_PAD | CUTANEOUS | Status: DC | PRN

## 2013-06-04 MED ORDER — ZOLPIDEM TARTRATE 5 MG PO TABS: 5.0000 mg | ORAL_TABLET | Freq: Every evening | ORAL | Status: DC | PRN

## 2013-06-04 MED ORDER — DIPHENHYDRAMINE HCL 50 MG/ML IJ SOLN: 12.5000 mg | INTRAMUSCULAR | Status: DC | PRN

## 2013-06-04 MED ORDER — DIPHENHYDRAMINE HCL 25 MG PO CAPS: 25.0000 mg | ORAL_CAPSULE | Freq: Four times a day (QID) | ORAL | Status: DC | PRN

## 2013-06-04 MED ORDER — TETANUS-DIPHTH-ACELL PERTUSSIS 5-2.5-18.5 LF-MCG/0.5 IM SUSP: 0.5000 mL | Freq: Once | INTRAMUSCULAR | Status: DC

## 2013-06-04 MED ORDER — FENTANYL 2.5 MCG/ML BUPIVACAINE 1/10 % EPIDURAL INFUSION (WH - ANES)
14.0000 mL/h | INTRAMUSCULAR | Status: DC | PRN
  Filled 2013-06-04: qty 125

## 2013-06-04 MED ORDER — OXYTOCIN 40 UNITS IN LACTATED RINGERS INFUSION - SIMPLE MED
1.0000 m[IU]/min | INTRAVENOUS | Status: DC
  Administered 2013-06-04: 2 m[IU]/min via INTRAVENOUS
  Filled 2013-06-04: qty 1000

## 2013-06-04 MED ORDER — EPHEDRINE 5 MG/ML INJ
10.0000 mg | INTRAVENOUS | Status: DC | PRN
  Filled 2013-06-04: qty 2

## 2013-06-04 MED ORDER — TERBUTALINE SULFATE 1 MG/ML IJ SOLN: 0.2500 mg | Freq: Once | INTRAMUSCULAR | Status: DC | PRN

## 2013-06-04 MED ORDER — LACTATED RINGERS IV SOLN
INTRAVENOUS | Status: DC
  Administered 2013-06-04: 10:00:00 via INTRAVENOUS

## 2013-06-04 MED ORDER — FENTANYL 2.5 MCG/ML BUPIVACAINE 1/10 % EPIDURAL INFUSION (WH - ANES)
INTRAMUSCULAR | Status: DC | PRN
  Administered 2013-06-04: 12.5 mL/h via EPIDURAL

## 2013-06-04 MED ORDER — LIDOCAINE HCL (PF) 1 % IJ SOLN
INTRAMUSCULAR | Status: DC | PRN
  Administered 2013-06-04: 4 mL
  Administered 2013-06-04: 3 mL

## 2013-06-04 NOTE — MAU Provider Note (Signed)
Chief Complaint:  Contractions  First Provider Initiated Contact with Patient 06/04/13 0757     HPI: Elizabeth Foley is a 27 y.o. G1P0 at [redacted]w[redacted]d who presents to maternity admissions reporting contractions. Denies leakage of fluid or vaginal bleeding. Good fetal movement.   Past Medical History: Past Medical History  Diagnosis Date  . No pertinent past medical history   . Medical history non-contributory     Past obstetric history: OB History   Grav Para Term Preterm Abortions TAB SAB Ect Mult Living   1              # Outc Date GA Lbr Len/2nd Wgt Sex Del Anes PTL Lv   1 CUR               Past Surgical History: Past Surgical History  Procedure Laterality Date  . No past surgeries      Family History: Family History  Problem Relation Age of Onset  . Alcohol abuse Neg Hx   . Arthritis Neg Hx   . Asthma Neg Hx   . Cancer Neg Hx   . Birth defects Neg Hx   . COPD Neg Hx   . Depression Neg Hx   . Diabetes Neg Hx   . Drug abuse Neg Hx   . Early death Neg Hx   . Hearing loss Neg Hx   . Heart disease Neg Hx   . Hyperlipidemia Neg Hx   . Hypertension Neg Hx   . Kidney disease Neg Hx   . Learning disabilities Neg Hx   . Mental illness Neg Hx   . Mental retardation Neg Hx   . Miscarriages / Stillbirths Neg Hx   . Stroke Neg Hx   . Vision loss Neg Hx     Social History: History  Substance Use Topics  . Smoking status: Never Smoker   . Smokeless tobacco: Never Used  . Alcohol Use: No    Allergies: No Known Allergies  Meds:  Prescriptions prior to admission  Medication Sig Dispense Refill  . Prenatal Vit-Fe Fumarate-FA (PRENATAL MULTIVITAMIN) TABS Take 1 tablet by mouth daily at 12 noon.        ROS: Pertinent findings in history of present illness.  Physical Exam  Blood pressure 110/74, pulse 104, temperature 97.6 F (36.4 C), resp. rate 18, height 5' 2.5" (1.588 m), weight 67.858 kg (149 lb 9.6 oz), last menstrual period 08/30/2012, SpO2 99.00%. GENERAL:  Well-developed, well-nourished female in mild distress.  HEENT: normocephalic HEART: normal rate RESP: normal effort ABDOMEN: Soft, non-tender, gravid appropriate for gestational age EXTREMITIES: Nontender, no edema NEURO: alert and oriented SPECULUM EXAM: Deferred  Dilation: 4 Effacement (%): 60 Cervical Position: Middle Station: 0 Presentation: Vertex Exam by:: Quintella Baton, RN   FHT:  Baseline 130 , moderate variability, accelerations present, no decelerations Contractions: q 2-5 mins, moderate   Labs: No results found for this or any previous visit (from the past 24 hour(s)).  Imaging:  NA  MAU Course: Dilation: 4 Effacement (%): 80 Cervical Position: posterior Station: 0 Presentation: Vertex Exam by:: Ivonne Andrew, CNM   Will observe x 1 hour and recheck cervix.  Care of pt turned over to Georges Mouse, CNM at 0800.  [place], [place] 06/04/2013 7:57 AM

## 2013-06-04 NOTE — Progress Notes (Addendum)
Elizabeth Foley is a 27 y.o. G1P0 at [redacted]w[redacted]d admitted for active labor  Subjective: Comfortable w/ epidural, no complaints  Objective: BP 104/68  Pulse 95  Temp(Src) 98.2 F (36.8 C) (Oral)  Resp 16  Ht 5' 2.5" (1.588 m)  Wt 67.858 kg (149 lb 9.6 oz)  BMI 26.91 kg/m2  SpO2 97%  LMP 08/30/2012      FHT:  FHR: 130 bpm, variability: moderate,  accelerations:  Present,  decelerations:  Absent UC:   regular, every 2-4 minutes SVE:   6-7/90/+1, attempted arom- unable to feel bag, feel fetal hair Questioned pt again about lof- now states did have some lof on Thurs at which time she was seen in MAU and had no pooling of fluid and fern was neg Abd: non-tender  1740: IUPC placed   Labs: Lab Results  Component Value Date   WBC 12.6* 06/04/2013   HGB 11.2* 06/04/2013   HCT 34.2* 06/04/2013   MCV 81.0 06/04/2013   PLT 206 06/04/2013    Assessment / Plan: SOL, ROM of unknown duration, minimal cervical change, will begin pitocin per protocol to achieve adequate labor/dilation Frequent position changes- exaggerated sims  Labor: progressing slowly Preeclampsia:  n/a Fetal Wellbeing:  Category I Pain Control:  Epidural I/D:  n/a Anticipated MOD:  NSVD  Marge Duncans 06/04/2013, 5:03 PM

## 2013-06-04 NOTE — H&P (Signed)
Elizabeth Foley is a 27 y.o. G1P0  female at [redacted]w[redacted]d by LMP which correlates well w/ 25.2wk u/s, presenting w/ SOL. SVE initially 4/60/0, progressed to 5/90/0 and was admitted. Initiated pnc @ [facility] @ 12.4wks then transferred to [facility]- Dr. Claiborne Billings @ 23.4wks. Genetic screening not done, normal anatomy u/s, 1hr glucola not done d/t ?language barrier, random cbg: 134, gbs neg.   Maternal Medical History:  Reason for admission: Contractions.   Contractions: Onset was 3-5 hours ago.   Frequency: regular.   Perceived severity is moderate.    Fetal activity: Perceived fetal activity is normal.   Last perceived fetal movement was within the past hour.    Prenatal complications: no prenatal complications Prenatal Complications - Diabetes: none.    OB History   Grav Para Term Preterm Abortions TAB SAB Ect Mult Living   1              Past Medical History  Diagnosis Date  . No pertinent past medical history   . Medical history non-contributory    Past Surgical History  Procedure Laterality Date  . No past surgeries     Family History: family history is negative for Alcohol abuse, and Arthritis, and Asthma, and Cancer, and Birth defects, and COPD, and Depression, and Diabetes, and Drug abuse, and Early death, and Hearing loss, and Heart disease, and Hyperlipidemia, and Hypertension, and Kidney disease, and Learning disabilities, and Mental illness, and Mental retardation, and Miscarriages / Stillbirths, and Stroke, and Vision loss, . Social History:  reports that she has never smoked. She has never used smokeless tobacco. She reports that she does not drink alcohol or use illicit drugs.   Review of Systems  Constitutional: Negative.   HENT: Negative.   Eyes: Negative.   Respiratory: Negative.   Cardiovascular: Negative.   Gastrointestinal: Positive for abdominal pain (uc's).  Genitourinary: Negative.   Musculoskeletal: Negative.   Skin: Negative.   Neurological: Negative.    Endo/Heme/Allergies: Negative.   Psychiatric/Behavioral: Negative.     Dilation: 5 Effacement (%): 90 Station: 0 Exam by:: C.Millican, RN  w/ bloody show Blood pressure 110/74, pulse 104, temperature 97.6 F (36.4 C), resp. rate 18, height 5' 2.5" (1.588 m), weight 67.858 kg (149 lb 9.6 oz), last menstrual period 08/30/2012, SpO2 99.00%. Maternal Exam:  Uterine Assessment: Contraction strength is moderate.  Contraction frequency is regular.   Abdomen: Patient reports no abdominal tenderness.   Physical Exam  Constitutional: She is oriented to person, place, and time. She appears well-developed and well-nourished.  HENT:  Head: Normocephalic.  Neck: Normal range of motion.  Cardiovascular: Normal rate and regular rhythm.   Respiratory: Effort normal and breath sounds normal.  GI: Soft.  gravid  Genitourinary:  SVE by MAU CNM & RN  Musculoskeletal: Normal range of motion.  Neurological: She is alert and oriented to person, place, and time. She has normal reflexes.  Skin: Skin is warm and dry.  Psychiatric: She has a normal mood and affect. Her behavior is normal. Judgment and thought content normal.    FHR: 130, mod variability, 15x15accels, no decels=Cat I UCs: q 2-4  Prenatal labs: ABO, Rh: O/POS/-- (02/17 1040) Antibody: NEG (02/17 1040) Rubella: 3.62 (02/17 1040) RPR: NON REAC (05/15 1008)  HBsAg: NEGATIVE (02/17 1040)  HIV: NON REACTIVE (05/15 1008)  GBS: NEGATIVE (06/06 1006)   Assessment/Plan: A:  [redacted]w[redacted]d SIUP  G1P0   SOL   Cat I FHR  GBS neg  P:  Admit to BS  IV  pain meds/epidural prn  Expectant management  Anticipate NSVD  Dr. Claiborne Billings notified of admission- to call her when pt 8cm   Marge Duncans 06/04/2013, 9:19 AM

## 2013-06-04 NOTE — MAU Note (Signed)
Contractions since 2300. Stronger since 0500. Some bloody show.

## 2013-06-04 NOTE — Progress Notes (Addendum)
Used [place] interpreter via [employer] for education, [phone]. Also provided Tdap VIS statement in [place]

## 2013-06-04 NOTE — Progress Notes (Signed)
Ivonne Andrew CNM notified of pt's admission, ctx pattern, sve, bloody show. Will obs and hour and reck cervix

## 2013-06-04 NOTE — Anesthesia Procedure Notes (Signed)
Epidural Patient location during procedure: OB Start time: 06/04/2013 3:47 PM  Staffing Anesthesiologist: Dravyn Severs A. Performed by: anesthesiologist   Preanesthetic Checklist Completed: patient identified, site marked, surgical consent, pre-op evaluation, timeout performed, IV checked, risks and benefits discussed and monitors and equipment checked  Epidural Patient position: sitting Prep: site prepped and draped and DuraPrep Patient monitoring: continuous pulse ox and blood pressure Approach: midline Injection technique: LOR air  Needle:  Needle type: Tuohy  Needle gauge: 17 G Needle length: 9 cm and 9 Needle insertion depth: 4 cm Catheter type: closed end flexible Catheter size: 19 Gauge Catheter at skin depth: 9 cm Test dose: negative and Other  Assessment Events: blood not aspirated, injection not painful, no injection resistance, negative IV test and no paresthesia  Additional Notes Patient identified. Risks and benefits discussed including failed block, incomplete  Pain control, post dural puncture headache, nerve damage, paralysis, blood pressure Changes, nausea, vomiting, reactions to medications-both toxic and allergic and post Partum back pain. All questions were answered. Patient expressed understanding and wished to proceed. Sterile technique was used throughout procedure. Epidural site was Dressed with sterile barrier dressing. No paresthesias, signs of intravascular injection Or signs of intrathecal spread were encountered.  Patient was more comfortable after the epidural was dosed. Please see RN's note for documentation of vital signs and FHR which are stable.

## 2013-06-04 NOTE — Anesthesia Preprocedure Evaluation (Signed)

## 2013-06-05 LAB — CBC
HCT: 31 % — ABNORMAL LOW (ref 36.0–46.0)
Platelets: 198 10*3/uL (ref 150–400)
RDW: 14.8 % (ref 11.5–15.5)
WBC: 14 10*3/uL — ABNORMAL HIGH (ref 4.0–10.5)

## 2013-06-05 MED ORDER — TETANUS-DIPHTH-ACELL PERTUSSIS 5-2.5-18.5 LF-MCG/0.5 IM SUSP: 0.5000 mL | Freq: Once | INTRAMUSCULAR | Status: DC

## 2013-06-05 NOTE — MAU Provider Note (Signed)
Attestation of Attending Supervision of Advanced Practitioner (CNM/NP): Evaluation and management procedures were performed by the Advanced Practitioner under my supervision and collaboration. I have reviewed the Advanced Practitioner's note and chart, and I agree with the management and plan.  Jermayne Sweeney H. 11:45 AM   

## 2013-06-05 NOTE — Progress Notes (Addendum)
Post Partum Day 1 Subjective: Eating, drinking, voiding, ambulating well. Lochia and pain wnl.  Denies dizziness, lightheadedness, or sob. No complaints.   Objective: Blood pressure 97/61, pulse 87, temperature 98.3 F (36.8 C), temperature source Oral, resp. rate 18, height 5' 2.5" (1.588 m), weight 67.858 kg (149 lb 9.6 oz), last menstrual period 08/30/2012, SpO2 98.00%, unknown if currently breastfeeding.  Physical Exam:  General: alert, cooperative and no distress Lochia: appropriate Uterine Fundus: firm Incision: n/a DVT Evaluation: No evidence of DVT seen on physical exam. Negative Homan's sign. No cords or calf tenderness. No significant calf/ankle edema.   Recent Labs  06/04/13 0955 06/05/13 0635  HGB 11.2* 10.2*  HCT 34.2* 31.0*    Assessment/Plan: Plan for discharge tomorrow, Breastfeeding, Lactation consult and Contraception micronor    LOS: 1 day   Marge Duncans 06/05/2013, 7:31 AM

## 2013-06-05 NOTE — Progress Notes (Signed)
Used [employer] [phone] to discuss breastfeeding--not offering a bottle, risks and benefits, baby is Dat(+) and staff will be checking for possible jaundice, IV removal, and HepB shot.

## 2013-06-05 NOTE — Anesthesia Postprocedure Evaluation (Signed)
  Anesthesia Post-op Note  Patient: Arts development officer  Procedure(s) Performed: * No procedures listed *  Patient Location: Mother/Baby  Anesthesia Type:Epidural  Level of Consciousness: awake  Airway and Oxygen Therapy: Patient Spontanous Breathing  Post-op Pain: mild  Post-op Assessment: Patient's Cardiovascular Status Stable and Respiratory Function Stable  Post-op Vital Signs: stable  Complications: No apparent anesthesia complications

## 2013-06-05 NOTE — Progress Notes (Signed)
Encouraged to place baby in crib if too sleepy to stay awake.

## 2013-06-06 ENCOUNTER — Telehealth: Payer: Self-pay | Admitting: *Deleted

## 2013-06-06 MED ORDER — IBUPROFEN 600 MG PO TABS: 600.0000 mg | ORAL_TABLET | Freq: Four times a day (QID) | ORAL | Status: DC

## 2013-06-06 NOTE — Progress Notes (Signed)
UR chart review completed.  

## 2013-06-06 NOTE — Telephone Encounter (Signed)
[facility] to cancel pt's BPP/NST for this week.  Also, called Induction at [facility] to cancel induction for 06/13/2013.Pt delivered this weekend.  Vallarie Fei, Darlyne Russian, CMA

## 2013-06-06 NOTE — Progress Notes (Signed)
Post Partum Day 2 Subjective: no complaints, up ad lib, voiding, tolerating PO and + flatus; reports no pain and no bleeding Pt seen with [place] interpreter; [employer].  Objective: Blood pressure 109/73, pulse 87, temperature 98 F (36.7 C), temperature source Oral, resp. rate 18, height 5' 2.5" (1.588 m), weight 67.858 kg (149 lb 9.6 oz), last menstrual period 08/30/2012, SpO2 95.00%, unknown if currently breastfeeding.  Physical Exam:  General: alert, cooperative and no distress Lochia: appropriate Uterine Fundus: firm Incision: NA DVT Evaluation: No evidence of DVT seen on physical exam. Negative Homan's sign. No cords or calf tenderness. No significant calf/ankle edema.   Recent Labs  06/04/13 0955 06/05/13 0635  HGB 11.2* 10.2*  HCT 34.2* 31.0*    Assessment/Plan: Discharge home, Breastfeeding and Contraception Micronor   LOS: 2 days   Kathalene Sporer 06/06/2013, 7:27 AM

## 2013-06-06 NOTE — Discharge Summary (Signed)
Obstetric Discharge Summary Reason for Admission: onset of labor Prenatal Procedures: none Intrapartum Procedures: spontaneous vaginal delivery Postpartum Procedures: none Complications-Operative and Postpartum: 2nd degree perineal laceration Hemoglobin  Date Value Range Status  06/05/2013 10.2* 12.0 - 15.0 g/dL Final     HCT  Date Value Range Status  06/05/2013 31.0* 36.0 - 46.0 % Final   [place] interpreter used; [employer].  Physical Exam:  General: alert, cooperative and no distress Lochia: appropriate Uterine Fundus: firm Incision: NA DVT Evaluation: No evidence of DVT seen on physical exam. Negative Homan's sign. No cords or calf tenderness. No significant calf/ankle edema.  Discharge Diagnoses: Term Pregnancy-delivered  Discharge Information: Date: 06/06/2013 Activity: unrestricted Diet: routine Medications: PNV and Ibuprofen Condition: stable Instructions: refer to practice specific booklet Discharge to: home   Newborn Data: Live born female  Birth Weight: 6 lb 10.3 oz (3014 g) APGAR: 9, 9  Home with mother.  Ardis Hughs 06/06/2013, 7:23 AM  I have seen and examined this patient and I agree with the above. Breastfeeding and plans on Micronor for contraception. Cam Hai 7:52 AM 06/06/2013

## 2013-06-06 NOTE — MAU Provider Note (Signed)
Attestation of Attending Supervision of Advanced Practitioner (CNM/NP): Evaluation and management procedures were performed by the Advanced Practitioner under my supervision and collaboration.  I have reviewed the Advanced Practitioner's note and chart, and I agree with the management and plan.  Iyanah Demont 06/06/2013 12:16 PM

## 2013-06-06 NOTE — Progress Notes (Signed)
I have seen and examined this patient and I agree with the above. Cam Hai 7:45 AM 06/06/2013

## 2013-06-08 ENCOUNTER — Other Ambulatory Visit: Payer: Medicaid Other

## 2013-06-08 ENCOUNTER — Ambulatory Visit: Payer: Self-pay

## 2013-06-08 NOTE — Lactation Note (Signed)
This note was copied from the chart of Elizabeth Otelia Brigance. Lactation Consultation Note     Follow up consult with this mom and dad. I showed mom the pumping rooms, and she expressed 50 mls of transitional milk. Mom loaned a DEP, and I instructed her in it's use. I will follow this family in the NICU.  Patient Name: Elizabeth Foley [id number] Date: 06/08/2013 Reason for consult: Follow-up assessment;NICU baby   Maternal Data    Feeding Feeding Type: Breast Milk Feeding method: Bottle Nipple Type: Slow - flow Length of feed: 20 min  LATCH Score/Interventions                      Lactation Tools Discussed/Used     Consult Status Consult Status: PRN Follow-up type:  (in NICU)    Alfred Levins 06/08/2013, 7:17 PM

## 2013-06-10 ENCOUNTER — Encounter: Payer: Medicaid Other | Admitting: Family Medicine

## 2013-06-10 ENCOUNTER — Ambulatory Visit: Payer: Self-pay

## 2013-06-10 NOTE — Lactation Note (Signed)
This note was copied from the chart of Girl Lottie Lakey. Lactation Consultation Note    Brief follow up consult with this mom and baby. Mom returned the loaner DEP today, because the baby is being discharged to home, and is breast feeding  well , on cue. Mom knows to call lactation for questions/concerns or o/p consults.  Patient Name: Girl Shayona Hibbitts [id number] Date: 06/10/2013 Reason for consult: Follow-up assessment;NICU baby   Maternal Data    Feeding Feeding Type: Breast Milk Feeding method: Breast  LATCH Score/Interventions Latch: Grasps breast easily, tongue down, lips flanged, rhythmical sucking.  Audible Swallowing: Spontaneous and intermittent  Type of Nipple: Everted at rest and after stimulation  Comfort (Breast/Nipple): Soft / non-tender     Hold (Positioning): No assistance needed to correctly position infant at breast.  LATCH Score: 10  Lactation Tools Discussed/Used     Consult Status Consult Status: Complete Follow-up type: Call as needed    Alfred Levins 06/10/2013, 2:50 PM

## 2013-06-13 ENCOUNTER — Inpatient Hospital Stay (HOSPITAL_COMMUNITY): Admission: RE | Admit: 2013-06-13 | Payer: Medicaid Other | Source: Ambulatory Visit

## 2013-06-13 NOTE — Discharge Summary (Signed)
Attestation of Attending Supervision of Advanced Practitioner (CNM/NP): Evaluation and management procedures were performed by the Advanced Practitioner under my supervision and collaboration. I have reviewed the Advanced Practitioner's note and chart, and I agree with the management and plan.  Trygve Thal H. 10:37 PM   

## 2013-07-20 ENCOUNTER — Ambulatory Visit (INDEPENDENT_AMBULATORY_CARE_PROVIDER_SITE_OTHER): Payer: Medicaid Other | Admitting: Family Medicine

## 2013-07-20 ENCOUNTER — Encounter: Payer: Self-pay | Admitting: Family Medicine

## 2013-07-20 DIAGNOSIS — Z309 Encounter for contraceptive management, unspecified: Secondary | ICD-10-CM

## 2013-07-20 LAB — POCT URINE PREGNANCY: Preg Test, Ur: NEGATIVE

## 2013-07-20 MED ORDER — MEDROXYPROGESTERONE ACETATE 150 MG/ML IM SUSP
150.0000 mg | Freq: Once | INTRAMUSCULAR | Status: AC
  Administered 2013-07-20: 150 mg via INTRAMUSCULAR

## 2013-07-20 NOTE — Progress Notes (Signed)
Patient ID: Elizabeth Foley, female   DOB: 06-May-1986, 27 y.o.   MRN: [id number] Subjective:     Elizabeth Foley is a 27 y.o. female who presents for a postpartum visit. She is 6 weeks postpartum following a spontaneous vaginal delivery. I have fully reviewed the prenatal and intrapartum course. The delivery was at 39.5 gestational weeks. Outcome: spontaneous vaginal delivery. Anesthesia: epidural. Postpartum course has been good. Baby's course has been good. Baby is feeding by breast. Bleeding no bleeding. Bowel function is normal. Bladder function is normal. Patient is not sexually active. Contraception method is Depo-Provera injections. Postpartum depression screening: negative.  The following portions of the patient's history were reviewed and updated as appropriate: allergies, current medications, past family history, past medical history, past social history, past surgical history and problem list.  Review of Systems Pertinent items are noted in HPI.   Objective:    BP 97/65  Pulse 94  Ht 5\' 3"  (1.6 m)  Wt 129 lb (58.514 kg)  BMI 22.86 kg/m2  General:  alert, cooperative and appears stated age   Breasts:  inspection negative, no nipple discharge or bleeding, no masses or nodularity palpable  Lungs: clear to auscultation bilaterally  Heart:  regular rate and rhythm, S1, S2 normal, no murmur, click, rub or gallop  Abdomen: soft, non-tender; bowel sounds normal; no masses,  no organomegaly   Vulva:  normal  Vagina: normal vagina   Assessment:   Normal 6 weeks postpartum exam.  Mother and baby are well. Family adapting to parenthood well.  Post-partum  depression screen normal  Breast feeding mother Contraception management  Plan:   1. Contraception: Depo-Provera injections, first given today after Urine preg 2. Follow up in: 3 months for next depo shot

## 2013-07-20 NOTE — Patient Instructions (Signed)

## 2014-10-16 ENCOUNTER — Encounter: Payer: Self-pay | Admitting: Family Medicine

## 2014-12-15 NOTE — L&D Delivery Note (Signed)
Delivery Note Patient with SROM at 3 AM. Patient's labor progressed without issue.  At 9:45 PM a viable female was delivered via Vaginal, Spontaneous Delivery (Presentation: Left Occiput Anterior).  APGAR: 8, 9; weight 8 lb 14 oz (4026 g).   Placenta status: Intact, Spontaneous.  Cord: 3 vessels with the following complications: None.  Cord pH: Not collected   Anesthesia: Epidural  Episiotomy: None Lacerations: 2nd degree;Perineal Suture Repair: 3.0 vicryl Est. Blood Loss (mL): 200  Mom to postpartum.  Baby to Couplet care / Skin to Skin.  Gonzalo Waymire Z Micco Bourbeau 11/24/2015, 11:16 PM

## 2015-04-27 ENCOUNTER — Encounter: Payer: Self-pay | Admitting: Family Medicine

## 2015-04-27 ENCOUNTER — Ambulatory Visit (INDEPENDENT_AMBULATORY_CARE_PROVIDER_SITE_OTHER): Payer: Self-pay | Admitting: Family Medicine

## 2015-04-27 VITALS — BP 97/64 | HR 95 | Wt 121.0 lb

## 2015-04-27 DIAGNOSIS — O0993 Supervision of high risk pregnancy, unspecified, third trimester: Secondary | ICD-10-CM | POA: Insufficient documentation

## 2015-04-27 DIAGNOSIS — Z3481 Encounter for supervision of other normal pregnancy, first trimester: Secondary | ICD-10-CM

## 2015-04-27 DIAGNOSIS — N912 Amenorrhea, unspecified: Secondary | ICD-10-CM

## 2015-04-27 DIAGNOSIS — R5383 Other fatigue: Secondary | ICD-10-CM | POA: Insufficient documentation

## 2015-04-27 LAB — POCT URINE PREGNANCY: PREG TEST UR: POSITIVE

## 2015-04-27 MED ORDER — PRENATAL VITAMINS 28-0.8 MG PO TABS: 1.0000 | ORAL_TABLET | Freq: Every day | ORAL | Status: DC

## 2015-04-27 NOTE — Patient Instructions (Signed)
Nice to meet you today.  We will set you up for prenatal care.  Someone will call you to schedule this.  Congratulations, Dr. Leonard SchwartzB  First Trimester of Pregnancy The first trimester of pregnancy is from week 1 until the end of week 12 (months 1 through 3). During this time, your baby will begin to develop inside you. At 6-8 weeks, the eyes and face are formed, and the heartbeat can be seen on ultrasound. At the end of 12 weeks, all the baby's organs are formed. Prenatal care is all the medical care you receive before the birth of your baby. Make sure you get good prenatal care and follow all of your doctor's instructions. HOME CARE  Medicines  Take medicine only as told by your doctor. Some medicines are safe and some are not during pregnancy.  Take your prenatal vitamins as told by your doctor.  Take medicine that helps you poop (stool softener) as needed if your doctor says it is okay. Diet  Eat regular, healthy meals.  Your doctor will tell you the amount of weight gain that is right for you.  Avoid raw meat and uncooked cheese.  If you feel sick to your stomach (nauseous) or throw up (vomit):  Eat 4 or 5 small meals a day instead of 3 large meals.  Try eating a few soda crackers.  Drink liquids between meals instead of during meals.  If you have a hard time pooping (constipation):  Eat high-fiber foods like fresh vegetables, fruit, and whole grains.  Drink enough fluids to keep your pee (urine) clear or pale yellow. Activity and Exercise  Exercise only as told by your doctor. Stop exercising if you have cramps or pain in your lower belly (abdomen) or low back.  Try to avoid standing for long periods of time. Move your legs often if you must stand in one place for a long time.  Avoid heavy lifting.  Wear low-heeled shoes. Sit and stand up straight.  You can have sex unless your doctor tells you not to. Relief of Pain or Discomfort  Wear a good support bra if your  breasts are sore.  Take warm water baths (sitz baths) to soothe pain or discomfort caused by hemorrhoids. Use hemorrhoid cream if your doctor says it is okay.  Rest with your legs raised if you have leg cramps or low back pain.  Wear support hose if you have puffy, bulging veins (varicose veins) in your legs. Raise (elevate) your feet for 15 minutes, 3-4 times a day. Limit salt in your diet. Prenatal Care  Schedule your prenatal visits by the twelfth week of pregnancy.  Write down your questions. Take them to your prenatal visits.  Keep all your prenatal visits as told by your doctor. Safety  Wear your seat belt at all times when driving.  Make a list of emergency phone numbers. The list should include numbers for family, friends, the hospital, and police and fire departments. General Tips  Ask your doctor for a referral to a local prenatal class. Begin classes no later than at the start of month 6 of your pregnancy.  Ask for help if you need counseling or help with nutrition. Your doctor can give you advice or tell you where to go for help.  Do not use hot tubs, steam rooms, or saunas.  Do not douche or use tampons or scented sanitary pads.  Do not cross your legs for long periods of time.  Avoid litter boxes and  soil used by cats.  Avoid all smoking, herbs, and alcohol. Avoid drugs not approved by your doctor.  Visit your dentist. At home, brush your teeth with a soft toothbrush. Be gentle when you floss. GET HELP IF:  You are dizzy.  You have mild cramps or pressure in your lower belly.  You have a nagging pain in your belly area.  You continue to feel sick to your stomach, throw up, or have watery poop (diarrhea).  You have a bad smelling fluid coming from your vagina.  You have pain with peeing (urination).  You have increased puffiness (swelling) in your face, hands, legs, or ankles. GET HELP RIGHT AWAY IF:   You have a fever.  You are leaking fluid from  your vagina.  You have spotting or bleeding from your vagina.  You have very bad belly cramping or pain.  You gain or lose weight rapidly.  You throw up blood. It may look like coffee grounds.  You are around people who have [place], fifth disease, or chickenpox.  You have a very bad headache.  You have shortness of breath.  You have any kind of trauma, such as from a fall or a car accident. Document Released: 05/19/2008 Document Revised: 04/17/2014 Document Reviewed: 10/11/2013 [facility] Patient Information 2015 [employer] [place] [place]. This information is not intended to replace advice given to you by your health care provider. Make sure you discuss any questions you have with your health care provider.

## 2015-04-27 NOTE — Assessment & Plan Note (Signed)
UPT positive, planned pregnancy [facility] and new OB appt Start prenatal vitamins daily Counseled about avoidance of tob/alcohol/drugs

## 2015-04-27 NOTE — Progress Notes (Signed)
   Subjective:   Elizabeth Foley is a 29 y.o.  621P1001 female with a history of previous NSVD 05/2013 here for pregnancy test.  LMP 03/09/15 Pregnancy test at home - positive last week  Reports fatigue a few days/week that predates pregnancy  Review of Systems:  Per HPI. All other systems reviewed and are negative.   PMH, PSH, Medications, Allergies, and FmHx reviewed and updated in EMR. - not taking any other medications - hasn't started prenatal vitamins  Social History: never smoker  Objective:  BP 97/64 mmHg  Pulse 95  Wt 121 lb (54.885 kg)  LMP 03/09/2015  Gen:  29 y.o. female in NAD HEENT: NCAT, MMM, EOMI, anicteric sclerae Resp: Non-labored Abd: Soft, NTND, BS present, no guarding or organomegaly Ext: WWP, no edema Neuro: Alert and oriented, speech normal   UPT: Positive    Assessment:     Elizabeth Foley is a 29 y.o.  202P1001   female here for confirmation of pregnancy.    Plan:     See problem list for problem-specific plans.   Erasmo DownerAngela M Scottie Metayer, MD PGY-1,  [facility] 04/27/2015  2:48 PM

## 2015-04-27 NOTE — Assessment & Plan Note (Signed)
Likely pregnancy-related Will check TSH with prenatal labs

## 2015-06-06 ENCOUNTER — Other Ambulatory Visit: Payer: Medicaid Other

## 2015-06-06 DIAGNOSIS — Z3481 Encounter for supervision of other normal pregnancy, first trimester: Secondary | ICD-10-CM

## 2015-06-06 NOTE — Progress Notes (Signed)
NEW OB LABS DONE TODAY Elizabeth Foley 

## 2015-06-07 LAB — OBSTETRIC PANEL
ANTIBODY SCREEN: NEGATIVE
BASOS ABS: 0 10*3/uL (ref 0.0–0.1)
BASOS PCT: 0 % (ref 0–1)
Eosinophils Absolute: 0.1 10*3/uL (ref 0.0–0.7)
Eosinophils Relative: 1 % (ref 0–5)
HEMATOCRIT: 32.4 % — AB (ref 36.0–46.0)
HEMOGLOBIN: 10.8 g/dL — AB (ref 12.0–15.0)
HEP B S AG: NEGATIVE
LYMPHS PCT: 25 % (ref 12–46)
Lymphs Abs: 1.3 10*3/uL (ref 0.7–4.0)
MCH: 28.3 pg (ref 26.0–34.0)
MCHC: 33.3 g/dL (ref 30.0–36.0)
MCV: 85 fL (ref 78.0–100.0)
MONOS PCT: 7 % (ref 3–12)
MPV: 9.5 fL (ref 8.6–12.4)
Monocytes Absolute: 0.4 10*3/uL (ref 0.1–1.0)
NEUTROS ABS: 3.6 10*3/uL (ref 1.7–7.7)
NEUTROS PCT: 67 % (ref 43–77)
Platelets: 183 10*3/uL (ref 150–400)
RBC: 3.81 MIL/uL — AB (ref 3.87–5.11)
RDW: 13 % (ref 11.5–15.5)
RH TYPE: POSITIVE
RUBELLA: 4.23 {index} — AB (ref <0.90)
WBC: 5.3 10*3/uL (ref 4.0–10.5)

## 2015-06-07 LAB — HIV ANTIBODY (ROUTINE TESTING W REFLEX): HIV: NONREACTIVE

## 2015-06-08 LAB — CULTURE, OB URINE
COLONY COUNT: NO GROWTH
Organism ID, Bacteria: NO GROWTH

## 2015-07-03 ENCOUNTER — Ambulatory Visit (INDEPENDENT_AMBULATORY_CARE_PROVIDER_SITE_OTHER): Payer: Medicaid Other | Admitting: Family Medicine

## 2015-07-03 ENCOUNTER — Other Ambulatory Visit (HOSPITAL_COMMUNITY)
Admission: RE | Admit: 2015-07-03 | Discharge: 2015-07-03 | Disposition: A | Discharge status: Home / Self Care | Payer: Medicaid Other | Source: Ambulatory Visit | Attending: Family Medicine | Admitting: Family Medicine

## 2015-07-03 ENCOUNTER — Encounter: Payer: Self-pay | Admitting: Family Medicine

## 2015-07-03 VITALS — BP 107/71 | HR 89 | Temp 98.6°F | Wt 132.0 lb

## 2015-07-03 DIAGNOSIS — Z111 Encounter for screening for respiratory tuberculosis: Secondary | ICD-10-CM | POA: Diagnosis not present

## 2015-07-03 DIAGNOSIS — Z7189 Other specified counseling: Secondary | ICD-10-CM | POA: Diagnosis not present

## 2015-07-03 DIAGNOSIS — Z1151 Encounter for screening for human papillomavirus (HPV): Secondary | ICD-10-CM | POA: Diagnosis present

## 2015-07-03 DIAGNOSIS — IMO0002 Reserved for concepts with insufficient information to code with codable children: Secondary | ICD-10-CM | POA: Insufficient documentation

## 2015-07-03 DIAGNOSIS — Z3481 Encounter for supervision of other normal pregnancy, first trimester: Secondary | ICD-10-CM

## 2015-07-03 DIAGNOSIS — Z113 Encounter for screening for infections with a predominantly sexual mode of transmission: Secondary | ICD-10-CM | POA: Insufficient documentation

## 2015-07-03 DIAGNOSIS — Z01419 Encounter for gynecological examination (general) (routine) without abnormal findings: Secondary | ICD-10-CM | POA: Insufficient documentation

## 2015-07-03 MED ORDER — PRENATAL VITAMINS 28-0.8 MG PO TABS: 1.0000 | ORAL_TABLET | Freq: Every day | ORAL | Status: DC

## 2015-07-03 NOTE — Assessment & Plan Note (Signed)
Refilled PNV. Now about 16 wks by LMP, will order anatomy U/S; She is from [place] so a PPD was placed today. She will be returning to [place] x1 month during September, so will check Hep A in hopes that vaccination is not needed.

## 2015-07-03 NOTE — Patient Instructions (Addendum)
Thank you for coming in today!  Congratulations on your pregnancy. You need to take prenatal vitamins each day. Avoid raw meats and cheeses, alcohol, and cat litter. You can continue to be as active as you feel comfortable being.    - [facility] about 1.5 - 2.5 weeks.  - We will also check your blood to see if you've ever had hepatitis A. If you haven't we need to vaccinate you prior to traveling to [place].  - We also need to check for TB (tuberculosis) with a skin test.  - Make an appointment to follow up in 4 weeks.   If you have any emergent problems you should call the clinic at [phone] or [facility] for [facility].  Take care and seek immediate care sooner if you develop any concerns. Please feel free to call with any questions or concerns at any time.  - Dr. Jarvis NewcomerGrunz

## 2015-07-03 NOTE — Progress Notes (Signed)
Elizabeth Foley is a 29 y.o. yo G2P1001 at 4937w4d by LMP who presents for her initial prenatal visit. Pregnancy is planned She reports breast tenderness, positive home pregnancy test and denies VB, discharge. Has felt baby move earlier this week.. She  isTaking PNV. See flow sheet for details.  PMH, POBH, FH, meds, allergies and Social Hx reviewed.  Prenatal exam: Gen: Well nourished, well developed.  No distress.  Vitals noted. HEENT: Normocephalic, atraumatic.  Neck supple without cervical lymphadenopathy, thyromegaly or thyroid nodules. Fair dentition. CV: RRR no murmur, gallops or rubs Lungs: CTA B.  Normal respiratory effort without wheezes or rales. Abd: soft, NTND. +BS.  Uterus not appreciated above pelvis. GU: Normal external female genitalia without lesions.  Nl vaginal, well rugated without lesions. No vaginal discharge.  Bimanual exam: No adnexal mass or TTP. No CMT.  Ext: No clubbing, cyanosis or edema. Psych: Normal grooming and dress.  Not depressed or anxious appearing.  Normal thought content and process without flight of ideas or looseness of associations PHQ-9: 4, not difficult.  Assessment/plan: 1) Pregnancy Unknown doing well.  Current pregnancy issues include: [facility] Dating is reliable Prenatal labs reviewed, notable for mild anemia, PNV with Fe ordered. Bleeding and pain precautions reviewed. Too late for genetic screening. Early glucola is not indicated.    Follow up 4 weeks.

## 2015-07-03 NOTE — Addendum Note (Signed)
Addended by: Jone BasemanFLEEGER, JESSICA D on: 07/03/2015 05:11 PM   Modules accepted: Orders

## 2015-07-04 LAB — CYTOLOGY - PAP

## 2015-07-05 ENCOUNTER — Encounter: Payer: Medicaid Other | Admitting: Family Medicine

## 2015-07-05 LAB — GC/CHLAMYDIA PROBE AMP (~~LOC~~) NOT AT ARMC
CHLAMYDIA, DNA PROBE: NEGATIVE
NEISSERIA GONORRHEA: NEGATIVE

## 2015-07-06 ENCOUNTER — Ambulatory Visit (INDEPENDENT_AMBULATORY_CARE_PROVIDER_SITE_OTHER): Payer: Medicaid Other | Admitting: *Deleted

## 2015-07-06 DIAGNOSIS — IMO0001 Reserved for inherently not codable concepts without codable children: Secondary | ICD-10-CM

## 2015-07-06 DIAGNOSIS — O9989 Other specified diseases and conditions complicating pregnancy, childbirth and the puerperium: Secondary | ICD-10-CM

## 2015-07-06 DIAGNOSIS — R7611 Nonspecific reaction to tuberculin skin test without active tuberculosis: Secondary | ICD-10-CM

## 2015-07-06 LAB — TB SKIN TEST
INDURATION: 15 mm
TB Skin Test: POSITIVE

## 2015-07-06 NOTE — Progress Notes (Signed)
   PPD Reading Note PPD read and results entered in EpicCare. Result: 15 mm induration. Interpretation: Positive If test not read within 48-72 hours of initial placement, patient advised to repeat in other arm 1-3 weeks after this test. Allergic reaction: no  Patient is [place], in [place] for about 3 years.  Patient denied any symptoms of TB at this time.  Patient is currently [redacted] weeks pregnant.  Precept with Dr. Randolm Idol; confirmed the positive PPD.  Patient informed that she will referred to [facility] TB clinic.  Someone from TB program will be contacting her to discuss preventative treatment and for chest x-ray.  Patient informed of negative PAP test result.  Phuong Pham: [place] interpreter used.  Clovis Pu, RN

## 2015-07-09 NOTE — Progress Notes (Deleted)
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 Will need CXR. HIV was neg 6/22, Hep

## 2015-07-09 NOTE — Progress Notes (Signed)
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 HIV neg 6/22. Needs CXR to distinguish LTBI from active TB. Will also likely require tx with INH + pyridoxine, but will review with preceptor.

## 2015-07-17 ENCOUNTER — Other Ambulatory Visit (HOSPITAL_COMMUNITY): Payer: Medicaid Other

## 2015-07-18 ENCOUNTER — Other Ambulatory Visit: Payer: Self-pay | Admitting: Family Medicine

## 2015-07-18 ENCOUNTER — Ambulatory Visit (HOSPITAL_COMMUNITY)
Admission: RE | Admit: 2015-07-18 | Discharge: 2015-07-18 | Disposition: A | Discharge status: Home / Self Care | Payer: Medicaid Other | Source: Ambulatory Visit | Attending: Family Medicine | Admitting: Family Medicine

## 2015-07-18 DIAGNOSIS — Z3481 Encounter for supervision of other normal pregnancy, first trimester: Secondary | ICD-10-CM

## 2015-07-31 ENCOUNTER — Ambulatory Visit
Admission: RE | Admit: 2015-07-31 | Discharge: 2015-07-31 | Disposition: A | Discharge status: Home / Self Care | Payer: No Typology Code available for payment source | Source: Ambulatory Visit | Attending: Infectious Disease | Admitting: Infectious Disease

## 2015-07-31 ENCOUNTER — Other Ambulatory Visit: Payer: Self-pay | Admitting: Infectious Disease

## 2015-07-31 DIAGNOSIS — R7611 Nonspecific reaction to tuberculin skin test without active tuberculosis: Secondary | ICD-10-CM

## 2015-08-02 ENCOUNTER — Ambulatory Visit (INDEPENDENT_AMBULATORY_CARE_PROVIDER_SITE_OTHER): Payer: Medicaid Other | Admitting: Family Medicine

## 2015-08-02 ENCOUNTER — Encounter: Payer: Self-pay | Admitting: Family Medicine

## 2015-08-02 DIAGNOSIS — Z3482 Encounter for supervision of other normal pregnancy, second trimester: Secondary | ICD-10-CM

## 2015-08-02 NOTE — Patient Instructions (Signed)
It was great to see you again.  - Pick up a different prenatal vitamin to take every day. If this gives you problems please keep eating green leafy vegetables.  - You will need a follow up ultrasound and another visit before going to [place] - If you have regular contractions that are 3-5 minutes apart for at least one hour, ANY bleeding or fluid from your vagina, or you do not feel the baby moving at least 10 times every 2 hours, please go to the [facility] or the nearest emergency room if not in [place]. - Your next visit will be with Dr. Kennon Rounds, a female provider.   ([place] translator used for this)

## 2015-08-02 NOTE — Progress Notes (Signed)
Deva Ron is a 29 y.o. G2P1001 at [redacted]w[redacted]d for routine follow up. Denies HA, visual changes, abd pain, VB, loss of fluid, UC's. +GFM.   See flow sheet for details. Lungs clear  A/P: Pregnancy at [redacted]w[redacted]d.  Doing well, complicated by +PPD, followed by [facility]. CXR this week showed no evidence of former or active TB. Anatomy scan reviewed:  - Estimated gestational age was [redacted]w[redacted]d, making adjusted current GA [redacted]w[redacted]d: Updated in EPIC. - Noted to have incomplete visualization of RVOT. Low-lying placenta (7 - 8 mm from os). EFW 43%ile.  - Follow up 4 weeks with repeat scan and visit prior to Sept 15 when she plans to travel to [place] for 1 month.  - Transition care to female provider.  - Preterm labor precautions reviewed.

## 2015-08-03 LAB — CULTURE, OB URINE
Colony Count: NO GROWTH
Organism ID, Bacteria: NO GROWTH

## 2015-08-27 ENCOUNTER — Ambulatory Visit (HOSPITAL_COMMUNITY): Payer: Medicaid Other

## 2015-08-28 ENCOUNTER — Other Ambulatory Visit: Payer: Self-pay | Admitting: Family Medicine

## 2015-08-28 ENCOUNTER — Ambulatory Visit (HOSPITAL_COMMUNITY)
Admission: RE | Admit: 2015-08-28 | Discharge: 2015-08-28 | Disposition: A | Discharge status: Home / Self Care | Payer: Medicaid Other | Source: Ambulatory Visit | Attending: Family Medicine | Admitting: Family Medicine

## 2015-08-28 ENCOUNTER — Encounter: Payer: Self-pay | Admitting: Family Medicine

## 2015-08-28 DIAGNOSIS — IMO0002 Reserved for concepts with insufficient information to code with codable children: Secondary | ICD-10-CM

## 2015-08-28 DIAGNOSIS — Z0489 Encounter for examination and observation for other specified reasons: Secondary | ICD-10-CM

## 2015-08-28 DIAGNOSIS — Z3482 Encounter for supervision of other normal pregnancy, second trimester: Secondary | ICD-10-CM | POA: Insufficient documentation

## 2015-08-28 DIAGNOSIS — Z227 Latent tuberculosis: Secondary | ICD-10-CM | POA: Insufficient documentation

## 2015-08-29 ENCOUNTER — Ambulatory Visit: Payer: Medicaid Other | Admitting: Family Medicine

## 2015-09-10 ENCOUNTER — Encounter: Payer: Medicaid Other | Admitting: Student

## 2015-10-04 ENCOUNTER — Encounter: Payer: Self-pay | Admitting: Family Medicine

## 2015-10-04 ENCOUNTER — Ambulatory Visit (INDEPENDENT_AMBULATORY_CARE_PROVIDER_SITE_OTHER): Payer: Medicaid Other | Admitting: Family Medicine

## 2015-10-04 VITALS — BP 122/70 | HR 82 | Temp 98.6°F | Wt 150.0 lb

## 2015-10-04 DIAGNOSIS — J069 Acute upper respiratory infection, unspecified: Secondary | ICD-10-CM

## 2015-10-04 MED ORDER — DEXTROMETHORPHAN-GUAIFENESIN 10-100 MG/5ML PO SYRP: 5.0000 mL | ORAL_SOLUTION | Freq: Two times a day (BID) | ORAL | Status: DC

## 2015-10-04 NOTE — Patient Instructions (Addendum)
Thank you for coming to see me today. It was a pleasure. Today we talked about:   Upper respiratory infection: You can use nasal saline for your congestion and Tylenol for your sore throat. You can use tea with honey to help with coughing but I will also prescribe Robitussin DM.  Please make an appointment to see Dr. Caroleen Hammanumley on 10/25 for follow-up.  If you have any questions or concerns, please do not hesitate to call the office at [phone].  Sincerely,  Jacquelin Hawkingalph Nettey, MD

## 2015-10-04 NOTE — Progress Notes (Signed)
    Subjective   Elizabeth Foley is a 29 y.o. female that presents for a same day visit  1. Fever: Symptoms started about one week ago. Her symptoms include fever (subjective), cough, sore throat, congestion and rhinorrhea. No chills. She recent returned from [place] two days ago and were there for about 32 days. No sick contacts. She has a history of positive PPD.  No hemoptysis. No vaginal discharge, bleeding or fluid. No abdominal pain. Good fetal movement.  ROS Per HPI  Social History  Substance Use Topics  . Smoking status: Never Smoker   . Smokeless tobacco: Never Used  . Alcohol Use: No    No Known Allergies  Objective   BP 122/70 mmHg  Pulse 82  Temp(Src) 98.6 F (37 C) (Oral)  Wt 150 lb (68.04 kg)  LMP 03/09/2015  General: Well appearing, no distress. HEENT: Pupils equal and reactive to light/accomodation. Extraocular movements intact bilaterally. Tympanic membranes normal bilaterally. Nares patent bilaterally. Oropharnx clear and moist. No cervical adenopathy bilaterally. Nonproductive cough. Respiratory/Chest: Clear to auscultation bilaterally with no wheezingpst  Assessment and Plan   Meds ordered this encounter  Medications  . Dextromethorphan-Guaifenesin 10-100 MG/5ML liquid    Sig: Take 5 mLs by mouth every 12 (twelve) hours.    Dispense:  118 mL    Refill:  0    URI: no red flags for bacterial infection. Do not think this is consistent with TB. No red flags for pregnancy. - Return precautions - conservative management - honey/tea for cough. Will prescribe Robitussin DM as well - Nasal saline OTC prn for congestion

## 2015-10-09 ENCOUNTER — Ambulatory Visit (INDEPENDENT_AMBULATORY_CARE_PROVIDER_SITE_OTHER): Payer: Medicaid Other | Admitting: Family Medicine

## 2015-10-09 VITALS — BP 115/71 | HR 102 | Temp 98.3°F | Wt 148.0 lb

## 2015-10-09 DIAGNOSIS — Z331 Pregnant state, incidental: Secondary | ICD-10-CM

## 2015-10-09 DIAGNOSIS — Z3493 Encounter for supervision of normal pregnancy, unspecified, third trimester: Secondary | ICD-10-CM

## 2015-10-09 LAB — CBC
HEMATOCRIT: 36.9 % (ref 36.0–46.0)
HEMOGLOBIN: 12.1 g/dL (ref 12.0–15.0)
MCH: 27.2 pg (ref 26.0–34.0)
MCHC: 32.8 g/dL (ref 30.0–36.0)
MCV: 82.9 fL (ref 78.0–100.0)
MPV: 9.9 fL (ref 8.6–12.4)
Platelets: 228 10*3/uL (ref 150–400)
RBC: 4.45 MIL/uL (ref 3.87–5.11)
RDW: 13.1 % (ref 11.5–15.5)
WBC: 7.3 10*3/uL (ref 4.0–10.5)

## 2015-10-09 LAB — HIV ANTIBODY (ROUTINE TESTING W REFLEX): HIV: NONREACTIVE

## 2015-10-09 LAB — GLUCOSE, CAPILLARY
Comment 1: 1
GLUCOSE-CAPILLARY: 157 mg/dL — AB (ref 65–99)

## 2015-10-09 NOTE — Progress Notes (Signed)
[name] is a 29yo female ate 34weeks and 0 days. Visit conducted with aid of [place] interpretor. Poor prenatal care, with this being only her third visit this pregnancy. Denies abdominal pain or contractions. Denies vaginal bleeding. Denies headaches or blurred vision. Continues to note fetal movement. Complains of back pain with numbness radiating down outer left leg to knee. Suspect sciatica. 1 hour glucola obtained and was elevated to 157; 3 hour glucola scheduled for 10/11/2015. CBC, RPR, and HIV obtained. Note that patient left after labs before visit could be completed despite discussing several times that we would discuss labs and plan. Will need tdap and flu shot at next visit. Scheduled for US due to smaller than expected fundal height (30cm at 34 weeks). Note history of positive PPD with negative CXR, [facility]. Follow up with two weeks with OB clinic.

## 2015-10-09 NOTE — Patient Instructions (Signed)
Thank you so much for coming to visit today! We will check several labs today. I will send you a letter with the results. Your blood sugar was elevated. Please return as scheduled for a three hour glucola test. Please go for an Ultrasound to check on the size of the baby. Please follow up with the OB clinic in two weeks.  Thanks again! Dr. Caroleen Hamman  Third Trimester of Pregnancy The third trimester is from week 29 through week 42, months 7 through 9. The third trimester is a time when the fetus is growing rapidly. At the end of the ninth month, the fetus is about 20 inches in length and weighs 6-10 pounds.  BODY CHANGES Your body goes through many changes during pregnancy. The changes vary from woman to woman.   Your weight will continue to increase. You can expect to gain 25-35 pounds (11-16 kg) by the end of the pregnancy.  You may begin to get stretch marks on your hips, abdomen, and breasts.  You may urinate more often because the fetus is moving lower into your pelvis and pressing on your bladder.  You may develop or continue to have heartburn as a result of your pregnancy.  You may develop constipation because certain hormones are causing the muscles that push waste through your intestines to slow down.  You may develop hemorrhoids or swollen, bulging veins (varicose veins).  You may have pelvic pain because of the weight gain and pregnancy hormones relaxing your joints between the bones in your pelvis. Backaches may result from overexertion of the muscles supporting your posture.  You may have changes in your hair. These can include thickening of your hair, rapid growth, and changes in texture. Some women also have hair loss during or after pregnancy, or hair that feels dry or thin. Your hair will most likely return to normal after your baby is born.  Your breasts will continue to grow and be tender. A yellow discharge may leak from your breasts called colostrum.  Your belly button  may stick out.  You may feel short of breath because of your expanding uterus.  You may notice the fetus "dropping," or moving lower in your abdomen.  You may have a bloody mucus discharge. This usually occurs a few days to a week before labor begins.  Your cervix becomes thin and soft (effaced) near your due date. WHAT TO EXPECT AT YOUR PRENATAL EXAMS  You will have prenatal exams every 2 weeks until week 36. Then, you will have weekly prenatal exams. During a routine prenatal visit:  You will be weighed to make sure you and the fetus are growing normally.  Your blood pressure is taken.  Your abdomen will be measured to track your baby's growth.  The fetal heartbeat will be listened to.  Any test results from the previous visit will be discussed.  You may have a cervical check near your due date to see if you have effaced. At around 36 weeks, your caregiver will check your cervix. At the same time, your caregiver will also perform a test on the secretions of the vaginal tissue. This test is to determine if a type of bacteria, Group B streptococcus, is present. Your caregiver will explain this further. Your caregiver may ask you:  What your birth plan is.  How you are feeling.  If you are feeling the baby move.  If you have had any abnormal symptoms, such as leaking fluid, bleeding, severe headaches, or abdominal cramping.  If you are using any tobacco products, including cigarettes, chewing tobacco, and electronic cigarettes.  If you have any questions. Other tests or screenings that may be performed during your third trimester include:  Blood tests that check for low iron levels (anemia).  Fetal testing to check the health, activity level, and growth of the fetus. Testing is done if you have certain medical conditions or if there are problems during the pregnancy.  HIV (human immunodeficiency virus) testing. If you are at high risk, you may be screened for HIV during your  third trimester of pregnancy. FALSE LABOR You may feel small, irregular contractions that eventually go away. These are called Braxton Hicks contractions, or false labor. Contractions may last for hours, days, or even weeks before true labor sets in. If contractions come at regular intervals, intensify, or become painful, it is best to be seen by your caregiver.  SIGNS OF LABOR   Menstrual-like cramps.  Contractions that are 5 minutes apart or less.  Contractions that start on the top of the uterus and spread down to the lower abdomen and back.  A sense of increased pelvic pressure or back pain.  A watery or bloody mucus discharge that comes from the vagina. If you have any of these signs before the 37th week of pregnancy, call your caregiver right away. You need to go to the hospital to get checked immediately. HOME CARE INSTRUCTIONS   Avoid all smoking, herbs, alcohol, and unprescribed drugs. These chemicals affect the formation and growth of the baby.  Do not use any tobacco products, including cigarettes, chewing tobacco, and electronic cigarettes. If you need help quitting, ask your health care provider. You may receive counseling support and other resources to help you quit.  Follow your caregiver's instructions regarding medicine use. There are medicines that are either safe or unsafe to take during pregnancy.  Exercise only as directed by your caregiver. Experiencing uterine cramps is a good sign to stop exercising.  Continue to eat regular, healthy meals.  Wear a good support bra for breast tenderness.  Do not use hot tubs, steam rooms, or saunas.  Wear your seat belt at all times when driving.  Avoid raw meat, uncooked cheese, cat litter boxes, and soil used by cats. These carry germs that can cause birth defects in the baby.  Take your prenatal vitamins.  Take 1500-2000 mg of calcium daily starting at the 20th week of pregnancy until you deliver your baby.  Try  taking a stool softener (if your caregiver approves) if you develop constipation. Eat more high-fiber foods, such as fresh vegetables or fruit and whole grains. Drink plenty of fluids to keep your urine clear or pale yellow.  Take warm sitz baths to soothe any pain or discomfort caused by hemorrhoids. Use hemorrhoid cream if your caregiver approves.  If you develop varicose veins, wear support hose. Elevate your feet for 15 minutes, 3-4 times a day. Limit salt in your diet.  Avoid heavy lifting, wear low heal shoes, and practice good posture.  Rest a lot with your legs elevated if you have leg cramps or low back pain.  Visit your dentist if you have not gone during your pregnancy. Use a soft toothbrush to brush your teeth and be gentle when you floss.  A sexual relationship may be continued unless your caregiver directs you otherwise.  Do not travel far distances unless it is absolutely necessary and only with the approval of your caregiver.  Take prenatal classes to  understand, practice, and ask questions about the labor and delivery.  Make a trial run to the hospital.  Pack your hospital bag.  Prepare the baby's nursery.  Continue to go to all your prenatal visits as directed by your caregiver. SEEK MEDICAL CARE IF:  You are unsure if you are in labor or if your water has broken.  You have dizziness.  You have mild pelvic cramps, pelvic pressure, or nagging pain in your abdominal area.  You have persistent nausea, vomiting, or diarrhea.  You have a bad smelling vaginal discharge.  You have pain with urination. SEEK IMMEDIATE MEDICAL CARE IF:   You have a fever.  You are leaking fluid from your vagina.  You have spotting or bleeding from your vagina.  You have severe abdominal cramping or pain.  You have rapid weight loss or gain.  You have shortness of breath with chest pain.  You notice sudden or extreme swelling of your face, hands, ankles, feet, or  legs.  You have not felt your baby move in over an hour.  You have severe headaches that do not go away with medicine.  You have vision changes.   This information is not intended to replace advice given to you by your health care provider. Make sure you discuss any questions you have with your health care provider.   Document Released: 11/25/2001 Document Revised: 12/22/2014 Document Reviewed: 02/01/2013 Elsevier Interactive Patient Education [employer]

## 2015-10-10 ENCOUNTER — Ambulatory Visit (HOSPITAL_COMMUNITY): Payer: Medicaid Other

## 2015-10-10 LAB — RPR

## 2015-10-11 ENCOUNTER — Other Ambulatory Visit: Payer: Medicaid Other

## 2015-10-11 DIAGNOSIS — Z3483 Encounter for supervision of other normal pregnancy, third trimester: Secondary | ICD-10-CM

## 2015-10-11 LAB — GLUCOSE, CAPILLARY: GLUCOSE-CAPILLARY: 84 mg/dL (ref 65–99)

## 2015-10-11 NOTE — Progress Notes (Signed)
3 hr gtt done today [facility] Annika Selke

## 2015-10-12 ENCOUNTER — Other Ambulatory Visit: Payer: Self-pay | Admitting: Family Medicine

## 2015-10-12 ENCOUNTER — Ambulatory Visit (HOSPITAL_COMMUNITY)
Admission: RE | Admit: 2015-10-12 | Discharge: 2015-10-12 | Disposition: A | Discharge status: Home / Self Care | Payer: Medicaid Other | Source: Ambulatory Visit | Attending: Family Medicine | Admitting: Family Medicine

## 2015-10-12 DIAGNOSIS — Z36 Encounter for antenatal screening of mother: Secondary | ICD-10-CM | POA: Insufficient documentation

## 2015-10-12 DIAGNOSIS — O26843 Uterine size-date discrepancy, third trimester: Secondary | ICD-10-CM | POA: Insufficient documentation

## 2015-10-12 DIAGNOSIS — Z3A34 34 weeks gestation of pregnancy: Secondary | ICD-10-CM | POA: Diagnosis not present

## 2015-10-12 DIAGNOSIS — Z3493 Encounter for supervision of normal pregnancy, unspecified, third trimester: Secondary | ICD-10-CM

## 2015-10-12 DIAGNOSIS — Z1389 Encounter for screening for other disorder: Secondary | ICD-10-CM

## 2015-10-12 LAB — GLUCOSE TOLERANCE, 3 HOURS
GLUCOSE, FASTING-GESTATIONAL: 81 mg/dL (ref 65–99)
Glucose Tolerance, 1 hour: 163 mg/dL (ref 70–189)
Glucose Tolerance, 2 hour: 141 mg/dL (ref 70–164)
Glucose, GTT - 3 Hour: 99 mg/dL (ref 70–144)

## 2015-11-06 ENCOUNTER — Ambulatory Visit (INDEPENDENT_AMBULATORY_CARE_PROVIDER_SITE_OTHER): Payer: Medicaid Other | Admitting: Student

## 2015-11-06 ENCOUNTER — Other Ambulatory Visit (HOSPITAL_COMMUNITY)
Admission: RE | Admit: 2015-11-06 | Discharge: 2015-11-06 | Disposition: A | Discharge status: Home / Self Care | Payer: Medicaid Other | Source: Ambulatory Visit | Attending: Family Medicine | Admitting: Family Medicine

## 2015-11-06 VITALS — BP 118/70 | HR 98 | Temp 97.7°F | Wt 157.0 lb

## 2015-11-06 DIAGNOSIS — Z113 Encounter for screening for infections with a predominantly sexual mode of transmission: Secondary | ICD-10-CM | POA: Diagnosis not present

## 2015-11-06 DIAGNOSIS — Z3493 Encounter for supervision of normal pregnancy, unspecified, third trimester: Secondary | ICD-10-CM | POA: Diagnosis not present

## 2015-11-06 LAB — OB RESULTS CONSOLE GC/CHLAMYDIA: Gonorrhea: NEGATIVE

## 2015-11-06 NOTE — Patient Instructions (Signed)
Follow up in 1 week If you have regular contractions, Loss of water like you broke your water, vaginal bleeding like a period [facility]. If you feel the baby is moving less, sit in a quiet room and count baby's  movement. You want at least 10 movements in 2 hours Call the office ay [phone] with questions or concerns

## 2015-11-07 LAB — CERVICOVAGINAL ANCILLARY ONLY
CHLAMYDIA, DNA PROBE: NEGATIVE
Neisseria Gonorrhea: NEGATIVE

## 2015-11-07 NOTE — Progress Notes (Signed)
Pt seen with [place] interpreter  29 y/o at 38/0 by U =22, Preg c/b limited PNC, abn 1 hour glucose tolerance test but normal 3 hour test S: Denies contractions, loss of fluid, vaginal bleeding and reports normal fetal movement O: See tab  A/P IUP at 38/0 Concern for small for gestational age and inability to view RVOT on US- repeat US on 10/28 EFW 68%, normal anatomy Abnormal 1 hr Glucose test- 3 hour normal Routine OB- continue PNV, GC/CT, GBS today PP plans- boy, unsure what contraception she would like, contraception discussed including LARC methods Labor precautions reviewed  RTC in 1 week  Elizabeth Foley A. Kennon RoundsHaney MD, MS Family Medicine Resident PGY-2 Pager [phone]

## 2015-11-08 LAB — STREP B DNA PROBE: GBSP: DETECTED

## 2015-11-14 ENCOUNTER — Ambulatory Visit (INDEPENDENT_AMBULATORY_CARE_PROVIDER_SITE_OTHER): Payer: Medicaid Other | Admitting: Student

## 2015-11-14 VITALS — BP 109/83 | HR 104 | Temp 97.8°F | Wt 155.9 lb

## 2015-11-14 DIAGNOSIS — R300 Dysuria: Secondary | ICD-10-CM

## 2015-11-14 LAB — POCT URINALYSIS DIPSTICK
BILIRUBIN UA: NEGATIVE
Glucose, UA: NEGATIVE
KETONES UA: NEGATIVE
Nitrite, UA: NEGATIVE
PH UA: 6.5
PROTEIN UA: NEGATIVE
RBC UA: NEGATIVE
SPEC GRAV UA: 1.015
Urobilinogen, UA: 0.2

## 2015-11-14 LAB — POCT WET PREP (WET MOUNT)
CLUE CELLS WET PREP WHIFF POC: NEGATIVE
WBC, Wet Prep HPF POC: >20

## 2015-11-14 MED ORDER — NITROFURANTOIN MONOHYD MACRO 100 MG PO CAPS: 100.0000 mg | ORAL_CAPSULE | Freq: Two times a day (BID) | ORAL | Status: DC

## 2015-11-14 MED ORDER — FLUCONAZOLE 150 MG PO TABS: 150.0000 mg | ORAL_TABLET | Freq: Once | ORAL | Status: DC

## 2015-11-14 MED ORDER — RANITIDINE HCL 150 MG PO TABS: 150.0000 mg | ORAL_TABLET | Freq: Two times a day (BID) | ORAL | Status: DC

## 2015-11-14 NOTE — Patient Instructions (Addendum)
Follow up in 1 week If you have strong regular contractions every 2 minutes or more frequent, for an hour, vaginal bleeding like a period or loss of fluid like you broke your water [facility]. If you feel baby is moving less, sit in a quiet place for 2 hours and count baby's movements. You want at least 10 movements in 2 hours If you have questions or concerns call theoffice at [phone] You were diagnosed with a Urinary tract infection and a vaginal yeast infection For your urinary tract infection you will be treated with macrobid twice daily for 7 days and for your yeast infection you will be treated with diflucan pill once

## 2015-11-14 NOTE — Progress Notes (Signed)
Pt seen with [place]namese interpreter  29 y/o at 39/1 by U=22, Preg c/b limited PNC, abn 1 hour glucose tolerance test but normal 3 hour test S: denies contractions, loss of fluid, vaginal bleeding, and reportys + fetal movement. Has been having intermittent " sharp vaginal pain" for the last 2 days. Typically occurs at night. She has also intermittently been having burning on urination for the last few days. Denies fevers/chills. Denies vaginal discharge, malodor O: See tab  A/P IUP at 39/1 Routine OB- GBS +, will need treatment in labor, GC/CT neg Vaginal irritation/dysuria- Wet prep showed yeast and Udip showed large leuks. Will treat with diflucan for vaginal yeast infection and macrobid for UTI, will follow UCx PP- boy, considering contraception, Labor precautions reviewed  Follow up in 1 weeks  Lacy Taglieri A. Kennon RoundsHaney MD, MS Family Medicine Resident PGY-2 Pager [phone]

## 2015-11-16 LAB — CULTURE, OB URINE
COLONY COUNT: NO GROWTH
Organism ID, Bacteria: NO GROWTH

## 2015-11-22 ENCOUNTER — Telehealth (HOSPITAL_COMMUNITY): Payer: Self-pay | Admitting: *Deleted

## 2015-11-22 ENCOUNTER — Ambulatory Visit (INDEPENDENT_AMBULATORY_CARE_PROVIDER_SITE_OTHER): Payer: Medicaid Other | Admitting: Student

## 2015-11-22 VITALS — BP 109/70 | HR 107 | Temp 98.1°F | Wt 157.7 lb

## 2015-11-22 DIAGNOSIS — O0001 Abdominal pregnancy with intrauterine pregnancy: Secondary | ICD-10-CM

## 2015-11-22 NOTE — Patient Instructions (Signed)
Return in 1 week You have been scheduled for induction of labor on 12/15, please [facility] at 7 AM If you have regular contractions, loss of fluid like you broke your water or vaginal bleeding like a period, [facility] [address], [place], [place] [id number]

## 2015-11-22 NOTE — Progress Notes (Signed)
Pt seen with vietnamese interpreter  29 y/o at 40/2 by U=22: Preg c/b limited PNC, abn 1 hour glucose tolerance test but normal 3 hour test S: reports one contraction last  Night, no LOF, no vaginal bleeding, + fetal movement. Reports some back and groin pain occasionally. Denies dysuria, vaginal irritation O See tab  Pelvic 4/0/0, cervix medium consistency, mid position Bishop score 6  A/P IUP at 40/2 Routine OB: GBS +, will need treatment in labor, cont PNV Delivery- now 40/2, will schedule induction at 41w, next available on 12/15 when she is at 41/2 PP plans: boy, would like condom/natural family planning for contraception, contraception counseling including LARC methods discussed  RTC 1 week Labor precautions given  Ailis Rigaud A. Kennon RoundsHaney MD, MS Family Medicine Resident PGY-2 Pager [phone]

## 2015-11-22 NOTE — Telephone Encounter (Signed)
Reverified induction time with Dr Jolayne Pantheronstant and time rescheduled to 12/13 0000 for 41 wk induction Interpreter number [phone]

## 2015-11-22 NOTE — Telephone Encounter (Signed)
Preadmission screen  

## 2015-11-22 NOTE — Telephone Encounter (Addendum)
Received call from Mckinley JewelAmy Landon, Preadmission Nurse [facility] [phone]). States NST and AFI must be done on 11/24/2015 per Dr. Jolayne Pantheronstant Requesting order for these tests. Also, she has rescheduled induction for 41 weeks on 11/27/15 at midnight. Amy was not able to reach patient to notify her of rescheduled induction and requests that we notify patient of new date and time when we call patient regarding NST and AFI. Fredderick SeveranceUCATTE, LAURENZE L, RN

## 2015-11-24 ENCOUNTER — Inpatient Hospital Stay (HOSPITAL_COMMUNITY)
Admission: AD | Admit: 2015-11-24 | Discharge: 2015-11-26 | DRG: 775 | Disposition: A | Discharge status: Home / Self Care | Payer: Medicaid Other | Source: Ambulatory Visit | Attending: Obstetrics and Gynecology | Admitting: Obstetrics and Gynecology

## 2015-11-24 ENCOUNTER — Encounter (HOSPITAL_COMMUNITY): Payer: Self-pay | Admitting: *Deleted

## 2015-11-24 ENCOUNTER — Inpatient Hospital Stay (HOSPITAL_COMMUNITY): Payer: Medicaid Other | Admitting: Anesthesiology

## 2015-11-24 DIAGNOSIS — K219 Gastro-esophageal reflux disease without esophagitis: Secondary | ICD-10-CM | POA: Diagnosis present

## 2015-11-24 DIAGNOSIS — Z3A40 40 weeks gestation of pregnancy: Secondary | ICD-10-CM

## 2015-11-24 DIAGNOSIS — O4292 Full-term premature rupture of membranes, unspecified as to length of time between rupture and onset of labor: Secondary | ICD-10-CM

## 2015-11-24 DIAGNOSIS — O480 Post-term pregnancy: Principal | ICD-10-CM | POA: Diagnosis present

## 2015-11-24 DIAGNOSIS — O99824 Streptococcus B carrier state complicating childbirth: Secondary | ICD-10-CM | POA: Diagnosis present

## 2015-11-24 DIAGNOSIS — Z8249 Family history of ischemic heart disease and other diseases of the circulatory system: Secondary | ICD-10-CM | POA: Diagnosis not present

## 2015-11-24 DIAGNOSIS — O9962 Diseases of the digestive system complicating childbirth: Secondary | ICD-10-CM | POA: Diagnosis present

## 2015-11-24 DIAGNOSIS — O9902 Anemia complicating childbirth: Secondary | ICD-10-CM | POA: Diagnosis present

## 2015-11-24 DIAGNOSIS — O4290 Premature rupture of membranes, unspecified as to length of time between rupture and onset of labor, unspecified weeks of gestation: Secondary | ICD-10-CM | POA: Diagnosis present

## 2015-11-24 LAB — TYPE AND SCREEN
ABO/RH(D): O POS
Antibody Screen: NEGATIVE

## 2015-11-24 LAB — CBC
HEMATOCRIT: 32.9 % — AB (ref 36.0–46.0)
Hemoglobin: 10.6 g/dL — ABNORMAL LOW (ref 12.0–15.0)
MCH: 26.6 pg (ref 26.0–34.0)
MCHC: 32.2 g/dL (ref 30.0–36.0)
MCV: 82.5 fL (ref 78.0–100.0)
PLATELETS: 211 10*3/uL (ref 150–400)
RBC: 3.99 MIL/uL (ref 3.87–5.11)
RDW: 15.2 % (ref 11.5–15.5)
WBC: 6.2 10*3/uL (ref 4.0–10.5)

## 2015-11-24 LAB — POCT FERN TEST: POCT FERN TEST: POSITIVE

## 2015-11-24 MED ORDER — ACETAMINOPHEN 325 MG PO TABS: 650.0000 mg | ORAL_TABLET | ORAL | Status: DC | PRN

## 2015-11-24 MED ORDER — OXYTOCIN BOLUS FROM INFUSION
500.0000 mL | INTRAVENOUS | Status: DC
  Administered 2015-11-24: 500 mL via INTRAVENOUS

## 2015-11-24 MED ORDER — OXYTOCIN 40 UNITS IN LACTATED RINGERS INFUSION - SIMPLE MED
1.0000 m[IU]/min | INTRAVENOUS | Status: DC
  Administered 2015-11-24: 2 m[IU]/min via INTRAVENOUS
  Filled 2015-11-24: qty 1000

## 2015-11-24 MED ORDER — TERBUTALINE SULFATE 1 MG/ML IJ SOLN
0.2500 mg | Freq: Once | INTRAMUSCULAR | Status: DC | PRN
  Filled 2015-11-24: qty 1

## 2015-11-24 MED ORDER — PHENYLEPHRINE 40 MCG/ML (10ML) SYRINGE FOR IV PUSH (FOR BLOOD PRESSURE SUPPORT)
80.0000 ug | PREFILLED_SYRINGE | INTRAVENOUS | Status: DC | PRN
  Filled 2015-11-24: qty 20
  Filled 2015-11-24: qty 2

## 2015-11-24 MED ORDER — PENICILLIN G POTASSIUM 5000000 UNITS IJ SOLR
2.5000 10*6.[IU] | INTRAVENOUS | Status: DC
  Administered 2015-11-24: 2.5 10*6.[IU] via INTRAVENOUS
  Filled 2015-11-24 (×6): qty 2.5

## 2015-11-24 MED ORDER — ONDANSETRON HCL 4 MG/2ML IJ SOLN: 4.0000 mg | Freq: Four times a day (QID) | INTRAMUSCULAR | Status: DC | PRN

## 2015-11-24 MED ORDER — OXYTOCIN 40 UNITS IN LACTATED RINGERS INFUSION - SIMPLE MED: 62.5000 mL/h | INTRAVENOUS | Status: DC

## 2015-11-24 MED ORDER — HYDROXYZINE HCL 50 MG PO TABS
50.0000 mg | ORAL_TABLET | Freq: Four times a day (QID) | ORAL | Status: DC | PRN
  Filled 2015-11-24: qty 1

## 2015-11-24 MED ORDER — LIDOCAINE HCL (PF) 1 % IJ SOLN
INTRAMUSCULAR | Status: DC | PRN
  Administered 2015-11-24 (×2): 4 mL via EPIDURAL

## 2015-11-24 MED ORDER — EPHEDRINE 5 MG/ML INJ
10.0000 mg | INTRAVENOUS | Status: DC | PRN
  Filled 2015-11-24: qty 2

## 2015-11-24 MED ORDER — FENTANYL 2.5 MCG/ML BUPIVACAINE 1/10 % EPIDURAL INFUSION (WH - ANES)
14.0000 mL/h | INTRAMUSCULAR | Status: DC | PRN
Start: 2015-11-24 — End: 2015-11-25
  Administered 2015-11-24: 14 mL/h via EPIDURAL
  Administered 2015-11-24: 12 mL/h via EPIDURAL
  Filled 2015-11-24: qty 125

## 2015-11-24 MED ORDER — LACTATED RINGERS IV SOLN
500.0000 mL | INTRAVENOUS | Status: DC | PRN
  Administered 2015-11-24: 500 mL via INTRAVENOUS

## 2015-11-24 MED ORDER — LACTATED RINGERS IV SOLN
INTRAVENOUS | Status: DC
  Administered 2015-11-24 (×2): via INTRAVENOUS

## 2015-11-24 MED ORDER — FLEET ENEMA 7-19 GM/118ML RE ENEM: 1.0000 | ENEMA | RECTAL | Status: DC | PRN

## 2015-11-24 MED ORDER — LIDOCAINE HCL (PF) 1 % IJ SOLN
30.0000 mL | INTRAMUSCULAR | Status: DC | PRN
  Administered 2015-11-24: 30 mL via SUBCUTANEOUS
  Filled 2015-11-24: qty 30

## 2015-11-24 MED ORDER — CITRIC ACID-SODIUM CITRATE 334-500 MG/5ML PO SOLN: 30.0000 mL | ORAL | Status: DC | PRN

## 2015-11-24 MED ORDER — OXYCODONE-ACETAMINOPHEN 5-325 MG PO TABS: 2.0000 | ORAL_TABLET | ORAL | Status: DC | PRN

## 2015-11-24 MED ORDER — FENTANYL CITRATE (PF) 100 MCG/2ML IJ SOLN: 100.0000 ug | INTRAMUSCULAR | Status: DC | PRN

## 2015-11-24 MED ORDER — OXYCODONE-ACETAMINOPHEN 5-325 MG PO TABS: 1.0000 | ORAL_TABLET | ORAL | Status: DC | PRN

## 2015-11-24 MED ORDER — DIPHENHYDRAMINE HCL 50 MG/ML IJ SOLN: 12.5000 mg | INTRAMUSCULAR | Status: DC | PRN

## 2015-11-24 MED ORDER — PENICILLIN G POTASSIUM 5000000 UNITS IJ SOLR
5.0000 10*6.[IU] | Freq: Once | INTRAMUSCULAR | Status: AC
  Administered 2015-11-24: 5 10*6.[IU] via INTRAVENOUS
  Filled 2015-11-24: qty 5

## 2015-11-24 NOTE — Anesthesia Procedure Notes (Signed)
Epidural Patient location during procedure: OB Start time: 11/24/2015 8:53 PM  Preanesthetic Checklist Completed: patient identified, site marked, surgical consent, pre-op evaluation, timeout performed, IV checked, risks and benefits discussed and monitors and equipment checked  Epidural Patient position: sitting Prep: site prepped and draped and DuraPrep Patient monitoring: continuous pulse ox and blood pressure Approach: midline Location: L3-L4 Injection technique: LOR air  Needle:  Needle type: Tuohy  Needle gauge: 17 G Needle length: 9 cm and 9 Needle insertion depth: 4 cm Catheter type: closed end flexible Catheter size: 19 Gauge Catheter at skin depth: 9 cm Test dose: negative and Other  Assessment Events: blood not aspirated, injection not painful, no injection resistance, negative IV test and no paresthesia  Additional Notes Patient identified. Risks and benefits discussed including failed block, incomplete  Pain control, post dural puncture headache, nerve damage, paralysis, blood pressure Changes, nausea, vomiting, reactions to medications-both toxic and allergic and post Partum back pain. All questions were answered. Patient expressed understanding and wished to proceed. Sterile technique was used throughout procedure. Epidural site was Dressed with sterile barrier dressing. No paresthesias, signs of intravascular injection Or signs of intrathecal spread were encountered.  Patient was more comfortable after the epidural was dosed. Please see RN's note for documentation of vital signs and FHR which are stable.

## 2015-11-24 NOTE — Anesthesia Preprocedure Evaluation (Signed)
Anesthesia Evaluation  Patient identified by MRN, date of birth, ID band Patient awake    Reviewed: Allergy & Precautions, Patient's Chart, lab work & pertinent test results  Airway Mallampati: III  TM Distance: >3 FB Neck ROM: Full    Dental no notable dental hx. (+) Teeth Intact   Pulmonary neg pulmonary ROS,    Pulmonary exam normal breath sounds clear to auscultation       Cardiovascular Normal cardiovascular exam Rhythm:Regular Rate:Normal     Neuro/Psych negative neurological ROS  negative psych ROS   GI/Hepatic Neg liver ROS, GERD  Medicated and Controlled,  Endo/Other  negative endocrine ROS  Renal/GU negative Renal ROS  negative genitourinary   Musculoskeletal negative musculoskeletal ROS (+)   Abdominal (+) - obese,   Peds  Hematology  (+) anemia ,   Anesthesia Other Findings   Reproductive/Obstetrics (+) Pregnancy                             Anesthesia Physical Anesthesia Plan  ASA: II  Anesthesia Plan: Epidural   Post-op Pain Management:    Induction:   Airway Management Planned: Natural Airway  Additional Equipment:   Intra-op Plan:   Post-operative Plan:   Informed Consent: I have reviewed the patients History and Physical, chart, labs and discussed the procedure including the risks, benefits and alternatives for the proposed anesthesia with the patient or authorized representative who has indicated his/her understanding and acceptance.     Plan Discussed with: Anesthesiologist  Anesthesia Plan Comments:         Anesthesia Quick Evaluation

## 2015-11-24 NOTE — MAU Note (Signed)
Pt states her water broke @ 0300, clear fluid.  Pt also states she is having uc's, denies bleeding.

## 2015-11-24 NOTE — Progress Notes (Signed)
Checking on pt., she is standing up by side of bed, needs to go to BR.  EFM DC'd.

## 2015-11-24 NOTE — Progress Notes (Signed)
Patient chart reviewed, due to hx POS PPD and negative CXR, " no evidence of acute or former disease" in pt from a [place] TB risk [place].   Patient evaluation in Health Dept  08/14/15 noted in Media section of notes. Pt was apparently given option of delay of therapy til after delivery.,  Case reviewed with ID attending , Pervis HockingKees Van Dam, and we do NOT need to use any isolation processes in pt's care. L&D notified. Joellyn HaffKim Booker aware. Will manage L&D as per routine., no special isolation processes necessary

## 2015-11-24 NOTE — H&P (Signed)
Elizabeth Foley is a 29 y.o. G41P1001 female at [redacted]w[redacted]d by 22wk u/s, presenting w/ report of leaking clear fluid since 0300.   Reports active fetal movement, contractions: irregular, vaginal bleeding: none, membranes: ruptured, clear fluid. Initiated prenatal care at [facility] w/ Dr. Jarvis Newcomer at 16 wks.   Most recent u/s 10/28 @ 34.3wks to f/u limited view RVOT- normal female, efw 68%/5lb9oz.  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 No pertinent past medical history   . Medical history non-contributory     Past Surgical History: Past Surgical History  Procedure Laterality Date  . No past surgeries      Obstetrical History: OB History    Gravida Para Term Preterm AB TAB SAB Ectopic Multiple Living   Social History: Social History   Social History  . Marital Status: Married    Spouse Name: Melene Plan  . Number of Children: 0  . Years of Education: 66   Social History Main Topics  . Smoking status: Never Smoker   . Smokeless tobacco: Never Used  . Alcohol Use: No  . Drug Use: No  . Sexual Activity:    Partners: Male    Birth Control/ Protection: None   Other Topics Concern  . None   Social History Narrative    Family History: Family History  Problem Relation Age of Onset  . Alcohol abuse Neg Hx   . Arthritis Neg Hx   . Asthma Neg Hx   . Birth defects Neg Hx   . COPD Neg Hx   . Depression Neg Hx   . Diabetes Neg Hx   . Drug abuse Neg Hx   . Early death Neg Hx   . Hearing loss Neg Hx   . Heart disease Neg Hx   . Hyperlipidemia Neg Hx   . Kidney disease Neg Hx   . Learning disabilities Neg Hx   . Mental illness Neg Hx   . Mental retardation Neg Hx   . Miscarriages / Stillbirths  Neg Hx   . Stroke Neg Hx   . Vision loss Neg Hx   . Hypertension Mother     Allergies: No Known Allergies  Prescriptions prior to admission  Medication Sig Dispense Refill Last Dose  . Dextromethorphan-Guaifenesin 10-100 MG/5ML liquid Take 5 mLs by mouth every 12 (twelve) hours. (Patient not taking: Reported on 11/24/2015) 118 mL 0 Not Taking at Unknown time  . fluconazole (DIFLUCAN) 150 MG tablet Take 1 tablet (150 mg total) by mouth once. (Patient not taking: Reported on 11/24/2015) 1 tablet 0 Completed Course at Unknown time  . nitrofurantoin, macrocrystal-monohydrate, (MACROBID) 100 MG capsule Take 1 capsule (100 mg total) by mouth 2 (two) times daily. (Patient not taking: Reported on 11/24/2015) 14 capsule 0 Not Taking at Unknown time  . ranitidine (ZANTAC) 150 MG tablet Take 1 tablet (150 mg total) by mouth 2 (two) times daily. (Patient not taking: Reported on 11/24/2015) 30 tablet 2 Not Taking at Unknown time     Prenatal Transfer Tool  Maternal Diabetes: No, failed 1hr- normal 3hr Genetic Screening: Declined Maternal Ultrasounds/Referrals: Normal Fetal Ultrasounds or other Referrals:  None Maternal Substance Abuse:  No Significant Maternal Medications:  None Significant Maternal Lab Results: Lab values include: Group B Strep positive, Other: +PPD neg CXR  Review of Systems  Pertinent pos/neg as indicated in HPI    Blood pressure 119/89, pulse 109, temperature 98.1 F (36.7 C), temperature source Oral, resp. rate 18, last menstrual period 03/09/2015. General appearance: alert, cooperative and no distress Lungs: clear to auscultation bilaterally Heart: regular rate and rhythm Abdomen: gravid, soft, non-tender Extremities: trace edema DTR's 2+  Fetal monitoring: FHR: 150 bpm, variability: moderate,  Accelerations: Present,  decelerations:  Absent Uterine activity: irregular  Dilation: 4 Effacement (%): Thick Station: -2 Exam by:: Dorrene GermanJ. Lowe RN Presentation:  cephalic   Prenatal labs: ABO, Rh: O/POS/-- (06/22 1205) Antibody: NEG (06/22 1205) Rubella: !Error! RPR: NON REAC (10/25 1107)  HBsAg: NEGATIVE (06/22 1205)  HIV: NONREACTIVE (10/25 1107)  GBS: DETECTED (11/22 1548)   1 hr Glucola: 157 3hr gtt: 81/163/141/99 Genetic screening:  declined Anatomy US: normal female  Results for orders placed or performed during the hospital encounter of 11/24/15 (from the past 24 hour(s))  Fern Test   Collection Time: 11/24/15 11:31 AM  Result Value Ref Range   POCT Fern Test Positive = ruptured amniotic membanes      Assessment:  973w4d SIUP  G2P1001  PROM  Cat 1 FHR  GBS DETECTED (11/22 1548)   ?Latent TB, +PPD, neg CXR  Plan:  Admit to BS  IV pain meds/epidural prn active labor  Pitocin per protocol  Anticipate SVB   ?Neg pressure room- discussed w/ JVF who also discussed w/ ID- does not need neg pressure room or any interventions in L&D  Called Dr. Jarvis NewcomerGrunz, [facility]- notified him of pt's admission- he will be unable to follow/deliver pt- discussed Rifampin w/ him- states they told pt she did not need to take  Marge DuncansBooker, Jolynn Bajorek Randall CNM, WHNP-BC 11/24/2015, 12:14 PM

## 2015-11-24 NOTE — Progress Notes (Addendum)
Labor Progress Note Elizabeth Foley is a 29 y.o. G2P1001 at 4738w4d presented for SROM  S: Patient doing well, currently has an epidural   O:  BP 134/90 mmHg  Pulse 89  Temp(Src) 98.1 F (36.7 C) (Oral)  Resp 20  Ht 5\' 2"  (1.575 m)  Wt 157 lb (71.215 kg)  BMI 28.71 kg/m2  SpO2 98%  LMP 03/09/2015 FHT 140 bpm/ moderate variability / accelerations present   CVE: Dilation: Lip/rim Effacement (%): 90 Cervical Position: Posterior Station: 0 Presentation: Vertex Exam by:: Violeta GelinasG. Whitfield, RN    A&P: 29 y.o. G2P1001 6038w4d presented for SROM  #Labor: Continue with Pit  #Pain: Epidural  #FWB:Catergory 1  #GBS positive, PCN G  [name] Angelene GiovanniZ Kallan Bischoff, MD 9:11 PM

## 2015-11-25 ENCOUNTER — Encounter (HOSPITAL_COMMUNITY): Payer: Self-pay | Admitting: *Deleted

## 2015-11-25 LAB — HIV ANTIBODY (ROUTINE TESTING W REFLEX): HIV Screen 4th Generation wRfx: NONREACTIVE

## 2015-11-25 LAB — RPR: RPR Ser Ql: NONREACTIVE

## 2015-11-25 MED ORDER — TETANUS-DIPHTH-ACELL PERTUSSIS 5-2.5-18.5 LF-MCG/0.5 IM SUSP
0.5000 mL | Freq: Once | INTRAMUSCULAR | Status: AC
  Administered 2015-11-25: 0.5 mL via INTRAMUSCULAR
  Filled 2015-11-25: qty 0.5

## 2015-11-25 MED ORDER — OXYCODONE-ACETAMINOPHEN 5-325 MG PO TABS
1.0000 | ORAL_TABLET | ORAL | Status: DC | PRN
  Administered 2015-11-25: 1 via ORAL
  Filled 2015-11-25: qty 1

## 2015-11-25 MED ORDER — OXYCODONE-ACETAMINOPHEN 5-325 MG PO TABS: 2.0000 | ORAL_TABLET | ORAL | Status: DC | PRN

## 2015-11-25 MED ORDER — PRENATAL MULTIVITAMIN CH
1.0000 | ORAL_TABLET | Freq: Every day | ORAL | Status: DC
  Administered 2015-11-25 – 2015-11-26 (×2): 1 via ORAL
  Filled 2015-11-25: qty 1

## 2015-11-25 MED ORDER — SENNOSIDES-DOCUSATE SODIUM 8.6-50 MG PO TABS: 2.0000 | ORAL_TABLET | ORAL | Status: DC

## 2015-11-25 MED ORDER — DIBUCAINE 1 % RE OINT: 1.0000 "application " | TOPICAL_OINTMENT | RECTAL | Status: DC | PRN

## 2015-11-25 MED ORDER — INFLUENZA VAC SPLIT QUAD 0.5 ML IM SUSY
0.5000 mL | PREFILLED_SYRINGE | INTRAMUSCULAR | Status: AC
  Administered 2015-11-26: 0.5 mL via INTRAMUSCULAR
  Filled 2015-11-25: qty 0.5

## 2015-11-25 MED ORDER — BENZOCAINE-MENTHOL 20-0.5 % EX AERO: 1.0000 "application " | INHALATION_SPRAY | CUTANEOUS | Status: DC | PRN

## 2015-11-25 MED ORDER — WITCH HAZEL-GLYCERIN EX PADS: 1.0000 "application " | MEDICATED_PAD | CUTANEOUS | Status: DC | PRN

## 2015-11-25 MED ORDER — ONDANSETRON HCL 4 MG/2ML IJ SOLN: 4.0000 mg | INTRAMUSCULAR | Status: DC | PRN

## 2015-11-25 MED ORDER — ZOLPIDEM TARTRATE 5 MG PO TABS: 5.0000 mg | ORAL_TABLET | Freq: Every evening | ORAL | Status: DC | PRN

## 2015-11-25 MED ORDER — ACETAMINOPHEN 325 MG PO TABS: 650.0000 mg | ORAL_TABLET | ORAL | Status: DC | PRN

## 2015-11-25 MED ORDER — ONDANSETRON HCL 4 MG PO TABS: 4.0000 mg | ORAL_TABLET | ORAL | Status: DC | PRN

## 2015-11-25 MED ORDER — SIMETHICONE 80 MG PO CHEW: 80.0000 mg | CHEWABLE_TABLET | ORAL | Status: DC | PRN

## 2015-11-25 MED ORDER — DIPHENHYDRAMINE HCL 25 MG PO CAPS: 25.0000 mg | ORAL_CAPSULE | Freq: Four times a day (QID) | ORAL | Status: DC | PRN

## 2015-11-25 MED ORDER — IBUPROFEN 600 MG PO TABS
600.0000 mg | ORAL_TABLET | Freq: Four times a day (QID) | ORAL | Status: DC
  Administered 2015-11-25 – 2015-11-26 (×3): 600 mg via ORAL
  Filled 2015-11-25 (×4): qty 1

## 2015-11-25 MED ORDER — LANOLIN HYDROUS EX OINT: TOPICAL_OINTMENT | CUTANEOUS | Status: DC | PRN

## 2015-11-25 NOTE — Lactation Note (Signed)
This note was copied from the chart of Elizabeth Foley. Lactation Consultation Note  Patient Name: Elizabeth Holley Bouchegoc Jiminez ZOXWR'UToday's Date: 11/25/2015 Reason for consult: Initial assessment Mom has started to supplement thinking baby hungry. Mom reports she supplemented with her 1st baby for the 1st 2 days till her milk came in then exclusively breast fed. This is what she plans to do with this baby. Encouraged to BF with each feeding both breasts before giving any supplements to encourage milk production. Reviewed risk of early supplementation to BF success. Advised baby should be at the breast 8-12 times or more in 24 hours. RN has set up DEBP for Mom to use and LC advised Mom she could post pump to have EBM to supplement. Also discussed hand expression to receive breast milk. Mom declined demonstration. Supplemental guidelines given to Mom. Lactation brochure left for review, advised of OP services and support group. Encouraged Mom to call for assist as needed. [place] interpreter [phone] used for visit.   Maternal Data Has patient been taught Hand Expression?: No (Mom reports she knows how to hand express, declined demonstration) Does the patient have breastfeeding experience prior to this delivery?: Yes  Feeding Feeding Type: Bottle Fed - Formula Nipple Type: Slow - flow Length of feed: 24 min  LATCH Score/Interventions                      Lactation Tools Discussed/Used Tools: Pump Breast pump type: Double-Electric Breast Pump   Consult Status Consult Status: Follow-up Date: 11/26/15 Follow-up type: In-patient    Alfred LevinsGranger, Torien Ramroop Ann 11/25/2015, 8:12 PM

## 2015-11-25 NOTE — Progress Notes (Signed)
Post Partum Day 1 Subjective: Eating, drinking, voiding, ambulating well.  +flatus.  Lochia and pain wnl.  Denies dizziness, lightheadedness, or sob. No complaints.   Objective: Blood pressure 108/83, pulse 93, temperature 97.9 F (36.6 C), temperature source Axillary, resp. rate 18, height 5\' 2"  (1.575 m), weight 71.215 kg (157 lb), last menstrual period 03/09/2015, SpO2 98 %, unknown if currently breastfeeding.  Physical Exam:  General: alert, cooperative and no distress Lochia: appropriate Uterine Fundus: firm Incision: n/a DVT Evaluation: No evidence of DVT seen on physical exam. Negative Homan's sign. No cords or calf tenderness. No significant calf/ankle edema.   Recent Labs  11/24/15 1310  HGB 10.6*  HCT 32.9*    Assessment/Plan: Plan for discharge tomorrow, Breastfeeding and Contraception undecided   LOS: 1 day   Marge DuncansBooker, Anarely Nicholls Randall 11/25/2015, 7:21 AM

## 2015-11-25 NOTE — Anesthesia Postprocedure Evaluation (Signed)
Anesthesia Post Note  Patient: Elizabeth Foley  Procedure(s) Performed: * No procedures listed *  Patient location during evaluation: Mother Baby Anesthesia Type: Epidural Level of consciousness: awake and alert and oriented Pain management: pain level controlled Vital Signs Assessment: post-procedure vital signs reviewed and stable Respiratory status: respiratory function stable Cardiovascular status: blood pressure returned to baseline Postop Assessment: no headache, no backache, patient able to bend at knees, no signs of nausea or vomiting and adequate PO intake Anesthetic complications: no    Last Vitals:  Filed Vitals:   11/25/15 0056 11/25/15 0430  BP: 106/69 108/83  Pulse: 89 93  Temp: 36.9 C 36.6 C  Resp: 16 18    Last Pain:  Filed Vitals:   11/25/15 0508  PainSc: 0-No pain                 Carlous Olivares

## 2015-11-26 ENCOUNTER — Inpatient Hospital Stay (HOSPITAL_COMMUNITY): Admission: RE | Admit: 2015-11-26 | Payer: Medicaid Other | Source: Ambulatory Visit

## 2015-11-26 MED ORDER — IBUPROFEN 600 MG PO TABS: 600.0000 mg | ORAL_TABLET | Freq: Four times a day (QID) | ORAL | Status: DC

## 2015-11-26 NOTE — Progress Notes (Signed)
UR chart review completed.  

## 2015-11-26 NOTE — Discharge Summary (Signed)
OB Discharge Summary  Patient Name: Elizabeth Foley DOB: [birth year] MRN: [id number]  Date of admission: 11/24/2015 Delivering MD: Berton BonMIKELL, ASIYAH ZAHRA   Date of discharge: 11/26/2015  Admitting diagnosis: 40 WKS 4 DAYS CTX EVERY 3 TO 5 MIN Intrauterine pregnancy: 5148w4d     Secondary diagnosis:Active Problems:   PROM (premature rupture of membranes)  Additional problems:none     Discharge diagnosis: Term Pregnancy Delivered                                                                     Post partum procedures:none  Augmentation: Pitocin  Complications: None  Hospital course:  Onset of Labor With Vaginal Delivery     29 y.o. yo [id number] at 2648w4d was admitted in Active Laboron 11/24/2015. Patient had an uncomplicated labor course as follows:  Membrane Rupture Time/Date: 3:00 AM ,11/24/2015   Intrapartum Procedures: Episiotomy: None [1]                                         Lacerations:  2nd degree [3];Perineal [11]  Patient had a delivery of a Viable infant. 11/24/2015  Information for the patient's newborn:  Elizabeth Foley, Elizabeth Foley [[id number]][[id number]]  Delivery Method: Vag-Spont    Pateint had an uncomplicated postpartum course.  She is ambulating, tolerating a regular diet, passing flatus, and urinating well. Patient is discharged home in stable condition on No discharge date for patient encounter.Marland Kitchen.    Physical exam  Filed Vitals:   11/24/15 2351 11/25/15 0056 11/25/15 0430 11/25/15 2036  BP: 110/66 106/69 108/83 105/61  Pulse: 85 89 93 97  Temp: 98.3 F (36.8 C) 98.4 F (36.9 C) 97.9 F (36.6 C) 98.4 F (36.9 C)  TempSrc: Axillary Axillary  Oral  Resp: 18 16 18 20   Height:      Weight:      SpO2: 99% 99% 98% 97%   General: alert, cooperative and no distress Lochia: appropriate Uterine Fundus: firm Incision: N/A DVT Evaluation: Negative Homan's sign. No cords or calf tenderness. Labs: Lab Results  Component Value Date   WBC 6.2 11/24/2015   HGB 10.6* 11/24/2015    HCT 32.9* 11/24/2015   MCV 82.5 11/24/2015   PLT 211 11/24/2015   No flowsheet data found.  Discharge instruction: per After Visit Summary and "Baby and Me Booklet".  After Visit Meds:    Medication List    ASK your doctor about these medications        Dextromethorphan-Guaifenesin 10-100 MG/5ML liquid  Take 5 mLs by mouth every 12 (twelve) hours.     fluconazole 150 MG tablet  Commonly known as:  DIFLUCAN  Take 1 tablet (150 mg total) by mouth once.     nitrofurantoin (macrocrystal-monohydrate) 100 MG capsule  Commonly known as:  MACROBID  Take 1 capsule (100 mg total) by mouth 2 (two) times daily.     ranitidine 150 MG tablet  Commonly known as:  ZANTAC  Take 1 tablet (150 mg total) by mouth 2 (two) times daily.        Diet: routine diet  Activity: Advance as tolerated. Pelvic rest for 6 weeks.  Outpatient follow up:6 weeks Follow up Appt:No future appointments. Follow up visit: No Follow-up on file.  Postpartum contraception: Undecided  Newborn Data: Live born female  Birth Weight: 8 lb 14 oz (4026 g) APGAR: 8, 9  Baby Feeding: Bottle and Breast Disposition:home with mother   11/26/2015 Ferdie Ping, CNM

## 2015-11-27 ENCOUNTER — Inpatient Hospital Stay (HOSPITAL_COMMUNITY): Admission: RE | Admit: 2015-11-27 | Payer: Medicaid Other | Source: Ambulatory Visit

## 2015-11-29 ENCOUNTER — Inpatient Hospital Stay (HOSPITAL_COMMUNITY): Admission: RE | Admit: 2015-11-29 | Payer: Medicaid Other | Source: Ambulatory Visit

## 2016-01-22 ENCOUNTER — Ambulatory Visit: Payer: Medicaid Other | Admitting: Family Medicine

## 2017-07-02 IMAGING — US US MFM OB FOLLOW-UP
1 series · 14 of 28 positions shown · non-contrast
Comparison: none

[Series 1: us mfm ob follow-up · 62 acquisitions, 14 frames shown]
[im 3/62]
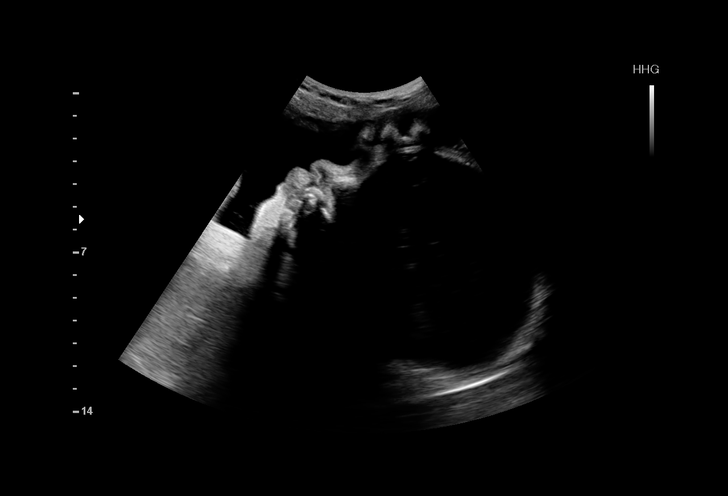
[im 7/62]
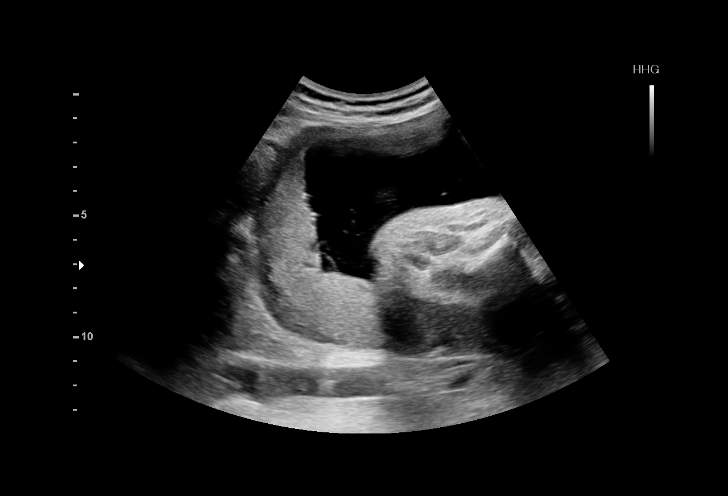
[im 12/62]
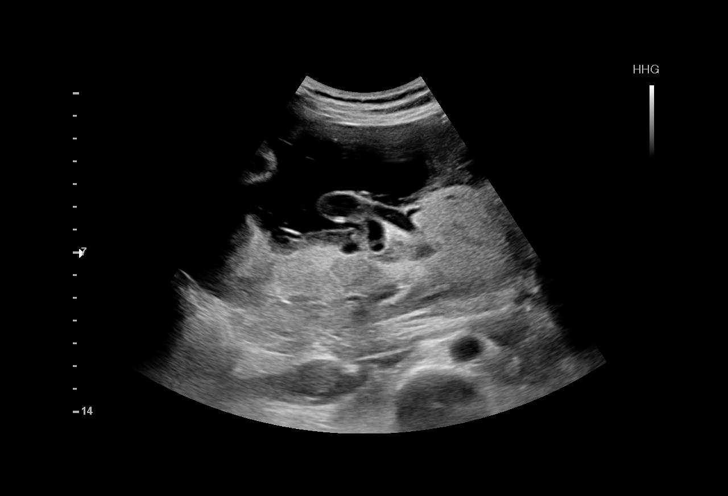
[im 16/62]
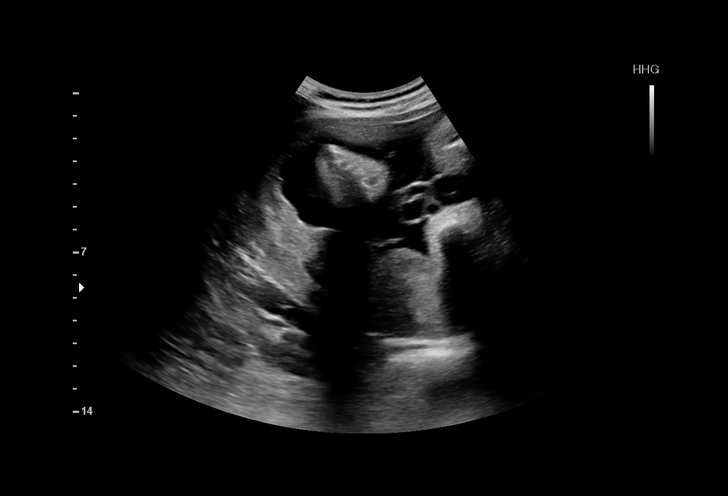
[im 21/62]
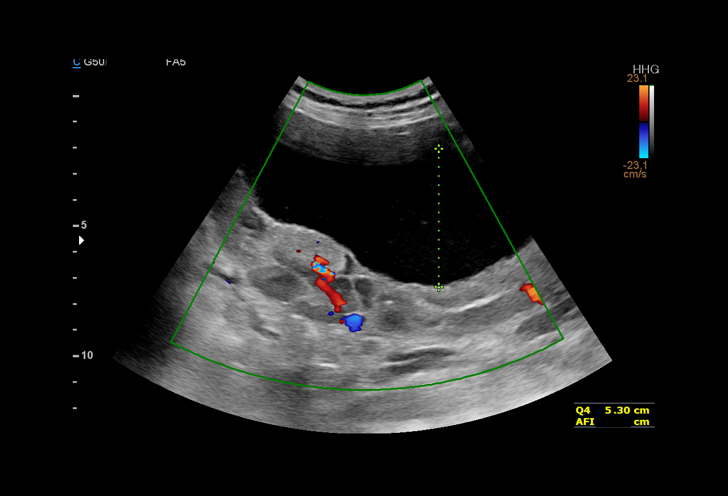
[im 25/62]
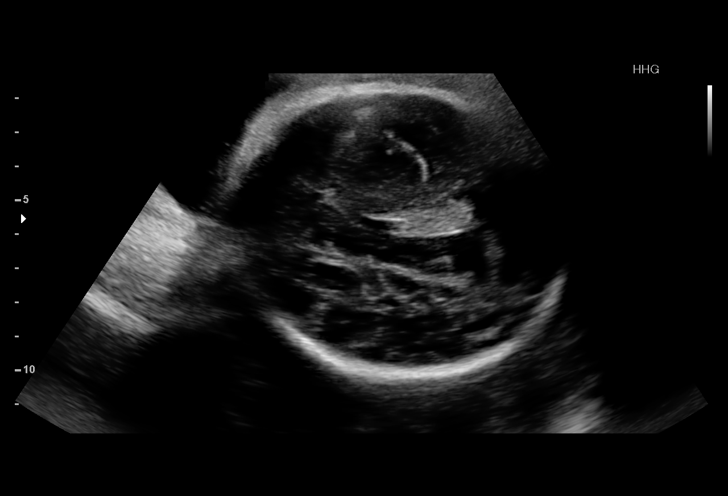
[im 30/62]
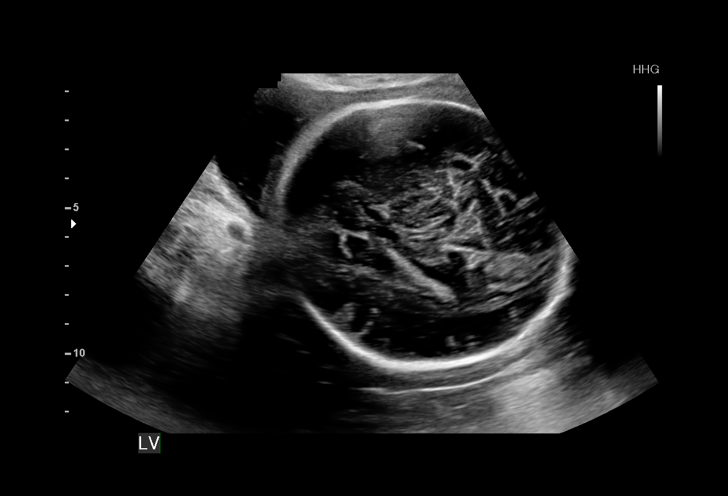
[im 34/62]
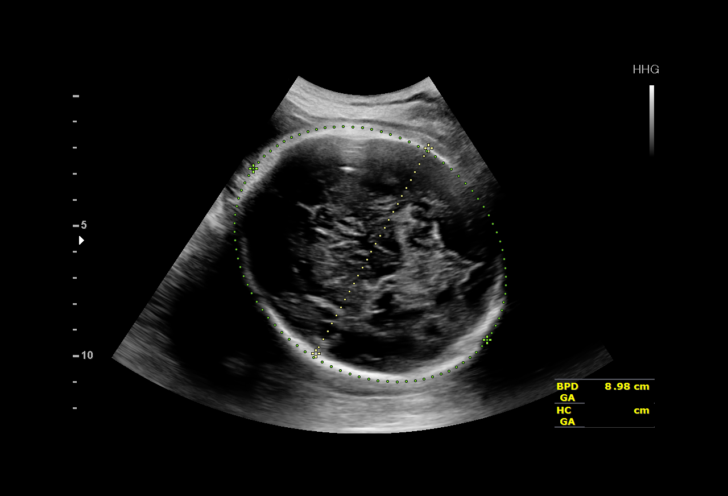
[im 39/62]
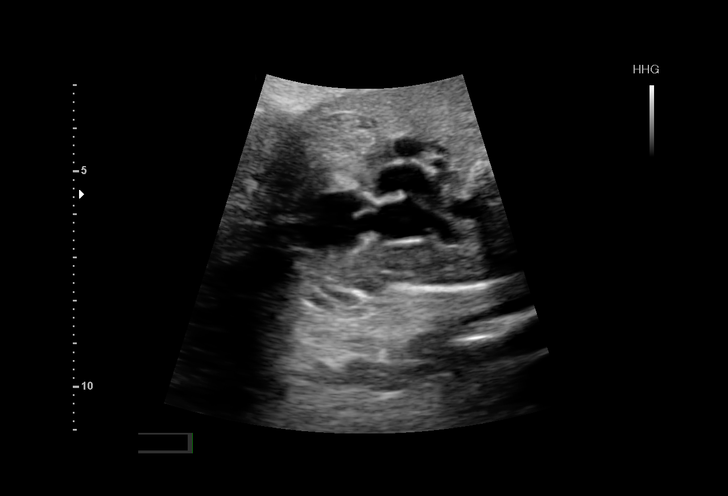
[im 43/62]
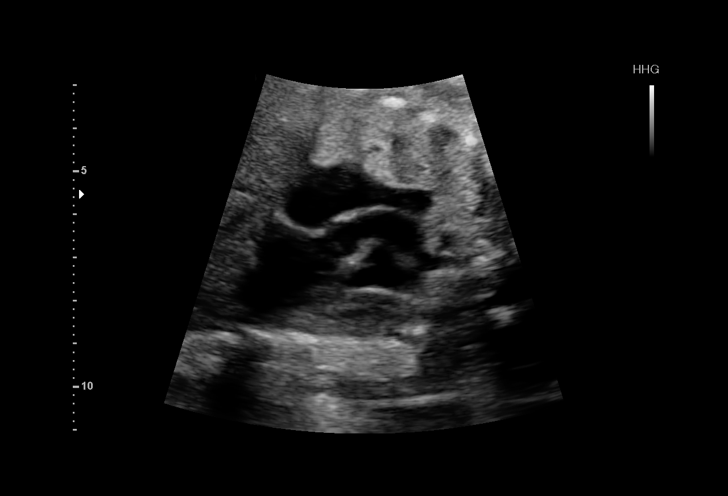
[im 48/62]
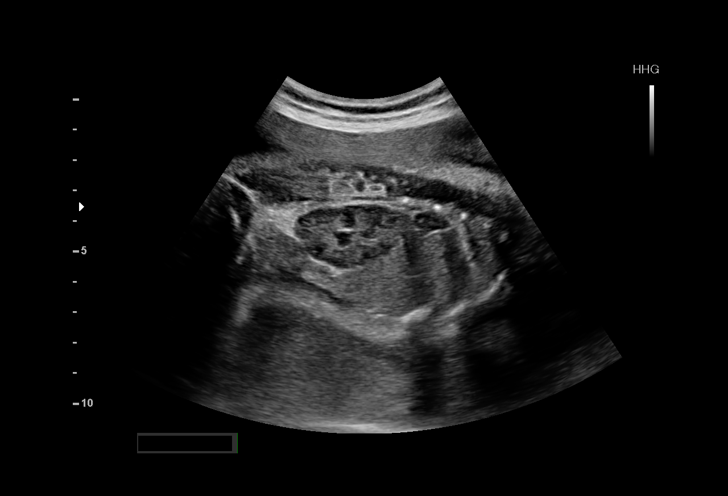
[im 52/62]
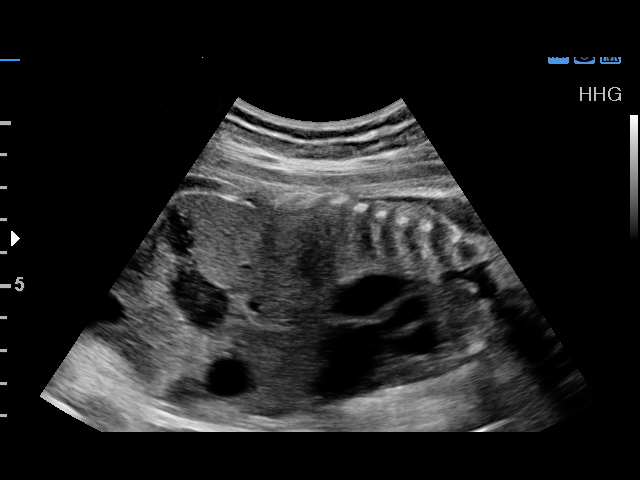
[im 57/62]
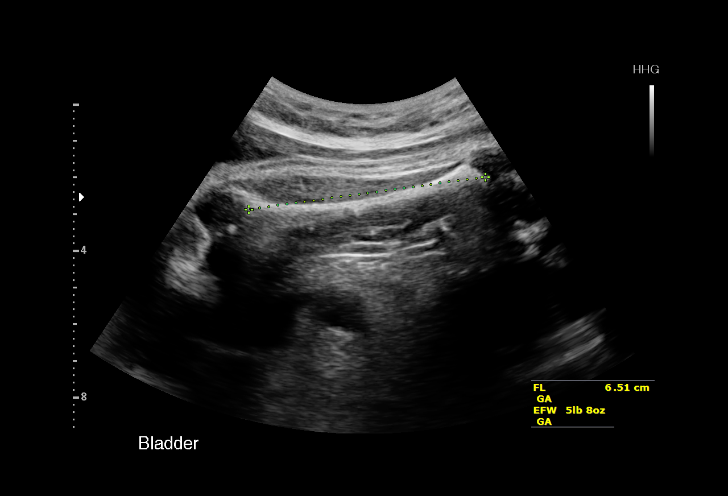
[im 62/62]
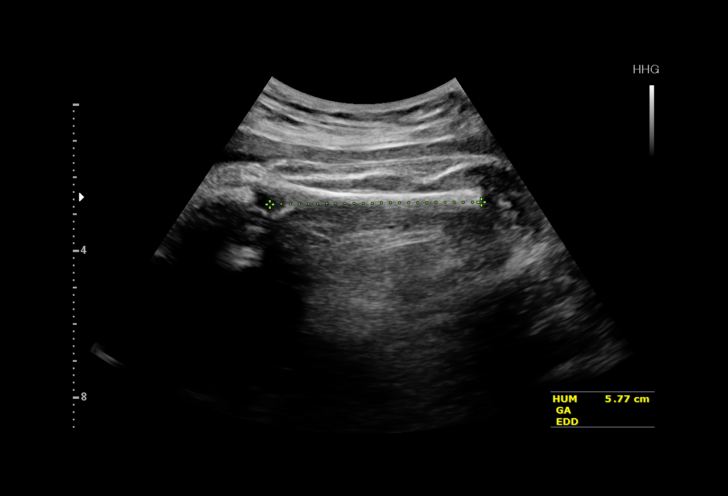

[14 of 28 positions shown; findings below may reference images not displayed]

OBSTETRICS REPORT
(Signed Final 10/12/2015 [DATE])

Name:       NEFERTARY NABARRO                             Visit  10/12/2015 [DATE]
Date:

Service(s) Provided

Indications

Follow-up incomplete fetal anatomic evaluation         Z36
Uterine size-date discrepancy, third trimester
34 weeks gestation of pregnancy
Fetal Evaluation

Num Of             1
Fetuses:
Fetal Heart        152                          bpm
Rate:
Cardiac Activity:  Observed
Presentation:      Cephalic
Placenta:          Posterior, above cervical
os
P. Cord            Visualized
Insertion:

Amniotic Fluid
AFI FV:      Subjectively within normal limits
AFI Sum:     20.88    cm      78  %Tile     Larg Pckt:    6.02   cm
RUQ:   6.02    cm    RLQ:   5.3     cm   LUQ:    3.84    cm   LLQ:    5.72   cm
Biometry

BPD:     89.7   m    G. Age:   36w 2d                 CI:        79.17   70 - 86
m
FL/HC:      20.7   19.4 -
21.8
HC:     318.7   m    G. Age:   35w 6d        52  %    HC/AC:      1.04   0.96 -
m
AC:     307.9   m    G. Age:   34w 5d        64  %    FL/BPD      73.5   71 - 87
m                                     :
FL:      65.9   m    G. Age:   34w 0d        29  %    FL/AC:      21.4   20 - 24
m
HUM:     58.1   m    G. Age:   33w 5d        46  %
m
Est.        6318   gm    5 lb 9 oz      68   %
FW:
Gestational Age

LMP:           31w 0d        Date:  03/09/15                  EDD:   12/14/15
U/S Today:     35w 2d                                         EDD:   11/14/15
Best:          34w 3d    Det. By:   U/S    (07/18/15)         EDD:   11/20/15
Anatomy

Cranium:          Appears normal         Aortic Arch:       Previously seen
Fetal Cavum:      Appears normal         Ductal Arch:       Previously seen
Ventricles:       Appears normal         Diaphragm:         Appears normal
Choroid Plexus:   Appears normal         Stomach:           Appears normal,
left sided
Cerebellum:       Previously seen        Abdomen:           Appears normal
Posterior         Previously seen        Abdominal          Previously seen
Fossa:                                   Wall:
Nuchal Fold:      Not applicable (>20    Cord Vessels:      Previously seen
wks GA)
Face:             Orbits and profile     Kidneys:           Appear normal
previously seen
Lips:             Previously seen        Bladder:           Appears normal
Heart:            Appears normal         Spine:             Previously seen
(4CH, axis, and
situs)
RVOT:             Appears normal         Lower              Previously seen
Extremities:
LVOT:             Appears normal         Upper              Previously seen
Extremities:

Other:   Fetus appears to be a male. Heels and 5th digit previously seen.
Technically difficult due to advanced gestational age.
Cervix Uterus Adnexa

Cervix:       Not visualized (advanced GA >30wks)

Left Ovary:    Not visualized.
Right Ovary:   Not visualized.

Adnexa:     No adnexal mass visualized.
Impression

Single IUP at 34w 3d
Normal interval anatomy
Fetal growth is appropriate (68th %tile)
Posterior placenta without previa
Normal amniotic fluid volume
Recommendations

Follow-up ultrasounds as clinically indicated.
questions or concerns.

## 2018-02-15 LAB — CULTURE, OB URINE: Urine Culture, OB: NO GROWTH

## 2018-02-15 LAB — OB RESULTS CONSOLE PLATELET COUNT: PLATELETS: 229

## 2018-02-15 LAB — OB RESULTS CONSOLE GC/CHLAMYDIA
CHLAMYDIA, DNA PROBE: NEGATIVE
GC PROBE AMP, GENITAL: NEGATIVE

## 2018-02-15 LAB — OB RESULTS CONSOLE RUBELLA ANTIBODY, IGM: RUBELLA: IMMUNE

## 2018-02-15 LAB — OB RESULTS CONSOLE RPR: RPR: NONREACTIVE

## 2018-02-15 LAB — OB RESULTS CONSOLE HGB/HCT, BLOOD
HCT: 36
HEMOGLOBIN: 11.7

## 2018-02-15 LAB — CYTOLOGY - PAP: Pap: NEGATIVE

## 2018-02-15 LAB — SICKLE CELL SCREEN: SICKLE CELL SCREEN: NORMAL

## 2018-02-15 LAB — OB RESULTS CONSOLE HEPATITIS B SURFACE ANTIGEN: Hepatitis B Surface Ag: NEGATIVE

## 2018-02-15 LAB — OB RESULTS CONSOLE VARICELLA ZOSTER ANTIBODY, IGG: VARICELLA IGG: IMMUNE

## 2018-02-15 LAB — OB RESULTS CONSOLE HIV ANTIBODY (ROUTINE TESTING): HIV: NONREACTIVE

## 2018-02-15 LAB — CYSTIC FIBROSIS DIAGNOSTIC STUDY: INTERPRETATION-CFDNA: NEGATIVE

## 2018-05-24 LAB — OB RESULTS CONSOLE HGB/HCT, BLOOD
HCT: 35
HEMOGLOBIN: 11.5

## 2018-05-24 LAB — GLUCOSE TOLERANCE, 1 HOUR: Glucose 1 Hour: 129

## 2018-06-02 ENCOUNTER — Ambulatory Visit (INDEPENDENT_AMBULATORY_CARE_PROVIDER_SITE_OTHER): Payer: BLUE CROSS/BLUE SHIELD | Admitting: Obstetrics and Gynecology

## 2018-06-02 ENCOUNTER — Ambulatory Visit: Payer: Self-pay

## 2018-06-02 ENCOUNTER — Encounter: Payer: Self-pay | Admitting: Obstetrics and Gynecology

## 2018-06-02 VITALS — BP 115/74 | HR 96 | Wt 154.7 lb

## 2018-06-02 DIAGNOSIS — O0993 Supervision of high risk pregnancy, unspecified, third trimester: Secondary | ICD-10-CM

## 2018-06-02 DIAGNOSIS — O4400 Placenta previa specified as without hemorrhage, unspecified trimester: Secondary | ICD-10-CM

## 2018-06-02 DIAGNOSIS — O4403 Placenta previa specified as without hemorrhage, third trimester: Secondary | ICD-10-CM

## 2018-06-02 DIAGNOSIS — R7611 Nonspecific reaction to tuberculin skin test without active tuberculosis: Secondary | ICD-10-CM

## 2018-06-02 DIAGNOSIS — Z0374 Encounter for suspected problem with fetal growth ruled out: Secondary | ICD-10-CM

## 2018-06-02 DIAGNOSIS — Z789 Other specified health status: Secondary | ICD-10-CM | POA: Insufficient documentation

## 2018-06-02 DIAGNOSIS — Z227 Latent tuberculosis: Secondary | ICD-10-CM

## 2018-06-02 DIAGNOSIS — Z603 Acculturation difficulty: Secondary | ICD-10-CM | POA: Insufficient documentation

## 2018-06-02 NOTE — Progress Notes (Signed)
Video Interpreter # [id number] New ob/28 wk packet given  OB US scheduled for June 26th @ 0915.  Pt notified.

## 2018-06-02 NOTE — Progress Notes (Signed)
Prenatal Visit Note Date: 06/02/2018 [facility]  [facility]  Subjective:  Elizabeth Foley is a 32 y.o. G3P2002 at 3529w1d being seen today for ongoing prenatal care.  She is currently monitored for the following issues for this high-risk pregnancy and has Supervision of high risk pregnancy, antepartum, third trimester; TB lung, latent; Language barrier; Placenta previa; and Fetal growth problem suspected but not found on their problem list.  Patient reports no complaints.  Had spotting on one day two weeks ago but none since Contractions: Not present. Vag. Bleeding: None.  Movement: Present. Denies leaking of fluid.   The following portions of the patient's history were reviewed and updated as appropriate: allergies, current medications, past family history, past medical history, past social history, past surgical history and problem list. Problem list updated.  Objective:   Vitals:   06/02/18 1400  BP: 115/74  Pulse: 96  Weight: 154 lb 11.2 oz (70.2 kg)    Fetal Status: Fetal Heart Rate (bpm): 156   Movement: Present     General:  Alert, oriented and cooperative. Patient is in no acute distress.  Skin: Skin is warm and dry. No rash noted.   Cardiovascular: Normal heart rate noted  Respiratory: Normal respiratory effort, no problems with respiration noted  Abdomen: Soft, gravid, appropriate for gestational age. Pain/Pressure: Present     Pelvic:  Cervical exam deferred        Extremities: Normal range of motion.  Edema: Trace  Mental Status: Normal mood and affect. Normal behavior. Normal judgment and thought content.   Urinalysis:      Assessment and Plan:  Pregnancy: G3P2002 at 3129w1d  1. Placenta previa antepartum, unspecified trimester See below. Continue pelvic rest - US MFM OB DETAIL +14 WK; Future  2. Language barrier Interpreter used  3. Possible FGR 05/24/18 [facility] [place] OB u/s showed persistent  anterior previa. EFW was 8.7% at 1119gm. They didn't do ac percent but she was 28/6 on that date and ac was 27/3 weeks, with AFI 14.2, CL 3.6cm; no dopplers done.  Will set up for asap anatomy u/s but no u/s availability to do today. Will do NST/AFI today and pt set up for formal mfm u/s for next week  4. Supervision of high risk pregnancy, antepartum, third trimester Paragard. 28wk labs negative  5. TB lung, latent No current issues  Preterm labor symptoms and general obstetric precautions including but not limited to vaginal bleeding, contractions, leaking of fluid and fetal movement were reviewed in detail with the patient. Please refer to After Visit Summary for other counseling recommendations.  Return in about 1 week (around 06/09/2018) for hrob visit. after her u/s.   [place] BingPickens, Elizabeth Colville, MD

## 2018-06-03 ENCOUNTER — Encounter: Payer: Self-pay | Admitting: Obstetrics and Gynecology

## 2018-06-07 ENCOUNTER — Encounter: Payer: Self-pay | Admitting: Obstetrics and Gynecology

## 2018-06-07 ENCOUNTER — Other Ambulatory Visit: Payer: Self-pay | Admitting: Obstetrics and Gynecology

## 2018-06-07 ENCOUNTER — Encounter (HOSPITAL_COMMUNITY): Payer: Self-pay

## 2018-06-07 ENCOUNTER — Ambulatory Visit (INDEPENDENT_AMBULATORY_CARE_PROVIDER_SITE_OTHER): Payer: BLUE CROSS/BLUE SHIELD | Admitting: Obstetrics and Gynecology

## 2018-06-07 ENCOUNTER — Ambulatory Visit (HOSPITAL_COMMUNITY)
Admission: RE | Admit: 2018-06-07 | Discharge: 2018-06-07 | Disposition: A | Discharge status: Home / Self Care | Payer: BLUE CROSS/BLUE SHIELD | Source: Ambulatory Visit | Attending: Obstetrics and Gynecology | Admitting: Obstetrics and Gynecology

## 2018-06-07 VITALS — BP 103/65 | HR 102 | Wt 156.9 lb

## 2018-06-07 DIAGNOSIS — O4403 Placenta previa specified as without hemorrhage, third trimester: Secondary | ICD-10-CM

## 2018-06-07 DIAGNOSIS — O365930 Maternal care for other known or suspected poor fetal growth, third trimester, not applicable or unspecified: Secondary | ICD-10-CM | POA: Diagnosis not present

## 2018-06-07 DIAGNOSIS — Z227 Latent tuberculosis: Secondary | ICD-10-CM

## 2018-06-07 DIAGNOSIS — O321XX0 Maternal care for breech presentation, not applicable or unspecified: Secondary | ICD-10-CM | POA: Insufficient documentation

## 2018-06-07 DIAGNOSIS — Z0374 Encounter for suspected problem with fetal growth ruled out: Secondary | ICD-10-CM

## 2018-06-07 DIAGNOSIS — O0993 Supervision of high risk pregnancy, unspecified, third trimester: Secondary | ICD-10-CM

## 2018-06-07 DIAGNOSIS — O4400 Placenta previa specified as without hemorrhage, unspecified trimester: Secondary | ICD-10-CM

## 2018-06-07 DIAGNOSIS — Z3A29 29 weeks gestation of pregnancy: Secondary | ICD-10-CM | POA: Diagnosis not present

## 2018-06-07 DIAGNOSIS — Z363 Encounter for antenatal screening for malformations: Secondary | ICD-10-CM | POA: Diagnosis not present

## 2018-06-07 DIAGNOSIS — R7611 Nonspecific reaction to tuberculin skin test without active tuberculosis: Secondary | ICD-10-CM | POA: Diagnosis not present

## 2018-06-07 DIAGNOSIS — Z789 Other specified health status: Secondary | ICD-10-CM

## 2018-06-07 NOTE — Progress Notes (Signed)
Subjective:  Elizabeth Foley is a 32 y.o. G3P2002 at 2587w6d being seen today for ongoing prenatal care.  She is currently monitored for the following issues for this high-risk pregnancy and has Supervision of high risk pregnancy, antepartum, third trimester; TB lung, latent; Language barrier; and Placenta previa on their problem list.  Patient reports no complaints.  Contractions: Not present. Vag. Bleeding: None.  Movement: Present. Denies leaking of fluid.   The following portions of the patient's history were reviewed and updated as appropriate: allergies, current medications, past family history, past medical history, past social history, past surgical history and problem list. Problem list updated.  Objective:   Vitals:   06/07/18 1403  BP: 103/65  Pulse: (!) 102  Weight: 156 lb 14.4 oz (71.2 kg)    Fetal Status: Fetal Heart Rate (bpm): 162   Movement: Present     General:  Alert, oriented and cooperative. Patient is in no acute distress.  Skin: Skin is warm and dry. No rash noted.   Cardiovascular: Normal heart rate noted  Respiratory: Normal respiratory effort, no problems with respiration noted  Abdomen: Soft, gravid, appropriate for gestational age. Pain/Pressure: Absent     Pelvic:  Cervical exam deferred        Extremities: Normal range of motion.  Edema: Trace  Mental Status: Normal mood and affect. Normal behavior. Normal judgment and thought content.   Urinalysis:      Assessment and Plan:  Pregnancy: G3P2002 at 7087w6d  1. Supervision of high risk pregnancy, antepartum, third trimester Stable  2. Placenta previa in third trimester Verbal report from Dr. Ezzard Foley, MFM, complete placenta previa but no IUGR Previa reviewed with pt, including c section at 36-37 weeks Instructed on pelvic rest and [facility] F/U U/S in 4 weeks   3. Language barrier Live Interrupter used today  4. TB lung, latent Stable  Preterm labor symptoms and  general obstetric precautions including but not limited to vaginal bleeding, contractions, leaking of fluid and fetal movement were reviewed in detail with the patient. Please refer to After Visit Summary for other counseling recommendations.  Return in about 2 weeks (around 06/21/2018) for OB visit.   Elizabeth Foley, Elizabeth Spradley L, MD

## 2018-06-07 NOTE — Patient Instructions (Signed)
Rau ti?n ??o (Placenta Previa) Rau ti?n ??o l m?t tnh tr?ng ? ph? n? mang [place], trong ? rau thai c?m su vo ph?n d??i c?a t? cung. Rau thai bao ph? m?t ph?n ho?c ton b? l? c? t? cung. ?y l m?t v?n ?? nan gi?i v trong qu trnh sinh n?, em b ph?i ra ngoi qua c? t? cung. C ba lo?i rau ti?n ??o:  Rau ti?n ??o bm mp. Rau thai v??n ra trong ph?m vi m?t inch (2,5 cm) l? c? t? cung nh?ng khng bao ph? l? c? t? cung.  Derrek GuRau ti?n ??o bn ph?n. Rau thai bao ph? m?t ph?n l? c? t? cung.  Derrek GuRau ti?n ??o hon ton. Rau thai bao ph? ton b? l? c? t? cung. N?u rau ti?n ??o l ki?u bm mp ho?c bn ph?n v ???c ch?n ?on ? n?a [place] k? ??u tin, rau [place] c th? di chuy?n vo v? tr bnh th??ng khi [place] k? ti?n tri?n v c th? khng cn bao ph? c? t? cung n?a. ?i?u quan tr?ng l ph?i duy tr m?i cu?c th?m khm ti?n s?n v?i chuyn gia ch?m [place] s?c kh?e ?? qu v? c th? ???c theo di st sao h?n. NGUYN NHN Khng r nguyn nhn gy ra tnh tr?ng ny. CC Y?U T? NGUY C? Tnh tr?ng ny hay x?y ra h?n ? nh?ng ph? n?:  Mang thai t? hai em b tr? ln (?a [place]).  T? cung c hnh d?ng b?t th??ng.  C s?o trn nim m?c t? cung.  Tr??c ?y ? c ph?u thu?t lin quan ??n t? cung, ch?ng h?n nh? m? ??.  ? sinh con tr??c ?y.  C ti?n s? b? rau ti?n ??o.  ? t?ng ht thu?c ho?c s? d?ng cocaine trong qu trnh mang thai.  ? ?? tu?i t? 35 tr? ln khi mang thai. TRI?U CH?NG Tri?u ch?ng chnh c?a rau ti?n ??o l ch?y mu m ??o ??t ng?t, khng ?au ? n?a sau c?a [place] k?[place]. Ban ??u, l??ng mu ch?y c th? r?t t v th??ng t? ch?m d?t. Cc ??t ch?y mu nhi?u h?n c?ng c th? x?y ra. M?t s? ph? n? c rau ti?n ??o c th? khng ch?y mu cht no. CH?N ?ON  Tnh tr?ng ny ???c ch?n ?on: ? T? siu m. Cch ki?m tra ny s? d?ng sng m ?? tm v? tr c?a rau [place] tr??c khi qu v? c b?t k? ??t ch?y mu no. ? Trong qu trnh ki?m tra s?c kh?e sau khi pht hi?n ch?y mu m ??o.  N?u qu v? ???c ch?n ?on b? rau ti?n  ??o bn ph?n ho?c hon ton, th??ng l s? ph?i trnh khm b?ng ngn tay. Chuyn gia ch?m [place] s?c kh?e s? v?n th?c hi?n khm b?ng d?ng c? m? v?t.  N?u qu v? khng siu m trong [place] k?, rau ti?n ??o c th? khng ???c ch?n ?on cho ??n khi x?y ra ch?y mu trong th?i gian chuy?n d?. ?I?U TR? ?i?u tr? tnh tr?ng b?nh l ny c th? bao g?m:  Gi?m ho?t ??ng.  N?m ngh? trn gi??ng t?i nh ho?c trong b?nh vi?n.  ?? vng khung ch?u ngh? ng?i. Khng ??t v?t g vo bn trong m ??o trong th?i gian vng khung ch?u ngh? ng?i. C ngh?a l khng quan h? tnh d?c v khng s? d?ng b?ng v? sinh d?ng nt bng ho?c d?ng c? th?t.  Truy?n mu ?? thay th? mu ? m?t (m?t mu ? m?).  Sinh m?. Ph??ng php ny c th? ???c th?c hi?n n?u: ? Mu ch?y nhi?u v khng th? ki?m sot. ? Rau thai bao ph? hon ton c? t? cung.  Cho dng thu?c ?? ng?n ch?n sinh non ho?c gip ph?i c?a tr? hon thi?n. Bi?n php ?i?u tr? ny c th? ???c s? d?ng n?u qu v? c?n sinh tr??c khi [place] k? ?? thng. Vi?c ?i?u tr? cho qu v? s? ???c quy?t ??nh d?a trn:  Qu v? b? ch?y bao nhiu mu, ho?c ? c?m mu hay ch?a.  Qu v? ? mang thai ???c bao lu.  Tnh tr?ng c?a tr? nh? th? no.  Lo?i rau ti?n ??o qu v? c. H??NG D?N CH?M [place] T?I NH  Ngh? ng?i nhi?u v gi?m b?t ho?t ??ng theo ch? d?n c?a chuyn gia ch?m [place] s?c kh?e.  N??m ngh? ng?i trn gi???ng trong khoa?ng th??i gian theo ch? d?n c?a chuyn gia ch?m [place] s?c kh?e.  Khng quan h? tnh d?c, khng s? d?ng b?ng v? d?ng nt bng, d?ng c? th?t, ho?c ??t b?t c? th? g vo bn trong m ??o n?u chuyn gia ch?m [place] s?c kh?e khuy?n co qu v? ?? vng khung ch?u ???c ngh? ng?i.  S? d?ng thu?c khng c?n k ??n v thu?c c?n k ??n theo ch? d?n c?a chuyn gia ch?m [place] s?c kh?e.  Tun th? t?t c? cc cu?c h?n khm l?i theo ch? d?n c?a chuyn gia ch?m [place] s?c kh?e. ?i?u ny c vai tr quan tr?ng. NGAY L?P T?C ?I KHM N?U:  Qu v? b? ch?y mu m ??o, ngay c? v?i l??ng nh? v ngay c? khi qu  v? khng ?au.  Qu v? b? ?au th?t ho?c th??ng xuyn co th?t.  Qu v? b? ?au ? b?ng ho?c ? th?t l?ng.  Qu v? c c?m gic p l?c gia t?ng ? vng khung ch?u.  Qu v? c d?ch nh?y ton n??c ho?c ton mu t? m ??o ch?y ra nhi?u h?n. Thng tin ny khng nh?m m?c ?ch thay th? cho l?i khuyn m chuyn gia ch?m [place] s?c kh?e ni v?i qu v?. Hy b?o ??m qu v? ph?i th?o lu?n b?t k? v?n ?? g m qu v? c v?i chuyn gia ch?m [place] s?c kh?e c?a qu v?. Document Released: 12/01/2005 Document Revised: 08/03/2013 Document Reviewed: 06/14/2016 Elsevier Interactive Patient Education  2018 [employer]

## 2018-06-08 ENCOUNTER — Other Ambulatory Visit (HOSPITAL_COMMUNITY): Payer: Self-pay | Admitting: *Deleted

## 2018-06-08 DIAGNOSIS — O4403 Placenta previa specified as without hemorrhage, third trimester: Secondary | ICD-10-CM

## 2018-06-09 ENCOUNTER — Ambulatory Visit (HOSPITAL_COMMUNITY): Payer: BLUE CROSS/BLUE SHIELD

## 2018-06-28 ENCOUNTER — Ambulatory Visit (INDEPENDENT_AMBULATORY_CARE_PROVIDER_SITE_OTHER): Payer: BLUE CROSS/BLUE SHIELD | Admitting: Family Medicine

## 2018-06-28 VITALS — BP 112/75 | HR 97 | Wt 159.4 lb

## 2018-06-28 DIAGNOSIS — Z789 Other specified health status: Secondary | ICD-10-CM

## 2018-06-28 DIAGNOSIS — O0993 Supervision of high risk pregnancy, unspecified, third trimester: Secondary | ICD-10-CM | POA: Diagnosis not present

## 2018-06-28 DIAGNOSIS — O4403 Placenta previa specified as without hemorrhage, third trimester: Secondary | ICD-10-CM | POA: Diagnosis not present

## 2018-06-28 NOTE — Progress Notes (Signed)
   PRENATAL VISIT NOTE  Subjective:  Elizabeth Foley is a 32 y.o. G3P2002 at 7726w6d being seen today for ongoing prenatal care.  She is currently monitored for the following issues for this high-risk pregnancy and has Supervision of high risk pregnancy, antepartum, third trimester; TB lung, latent; Language barrier; and Placenta previa on their problem list.  Patient reports no complaints.  Contractions: Not present. Vag. Bleeding: None.  Movement: Present. Denies leaking of fluid.   The following portions of the patient's history were reviewed and updated as appropriate: allergies, current medications, past family history, past medical history, past social history, past surgical history and problem list. Problem list updated.  Objective:   Vitals:   06/28/18 1344  BP: 112/75  Pulse: 97  Weight: 159 lb 6.4 oz (72.3 kg)    Fetal Status: Fetal Heart Rate (bpm): 156 Fundal Height: 34 cm Movement: Present     General:  Alert, oriented and cooperative. Patient is in no acute distress.  Skin: Skin is warm and dry. No rash noted.   Cardiovascular: Normal heart rate noted  Respiratory: Normal respiratory effort, no problems with respiration noted  Abdomen: Soft, gravid, appropriate for gestational age.  Pain/Pressure: Absent     Pelvic: Cervical exam deferred        Extremities: Normal range of motion.  Edema: Trace  Mental Status: Normal mood and affect. Normal behavior. Normal judgment and thought content.   Assessment and Plan:  Pregnancy: G3P2002 at 3526w6d  1. Supervision of high risk pregnancy, antepartum, third trimester FHT and FH normal  2. Placenta previa in third trimester US next week. Will schedule c/s for 36-37 weeks. Precautions given.  3. Language barrier Interpreter used.  Preterm labor symptoms and general obstetric precautions including but not limited to vaginal bleeding, contractions, leaking of fluid and fetal movement were reviewed in detail with the patient. Please  refer to After Visit Summary for other counseling recommendations.  Return in about 2 weeks (around 07/12/2018).  Future Appointments  Date Time Provider Department Center  07/05/2018  2:30 PM [facility] US 1 [facility]    Levie HeritageJacob J Christinamarie Tall, DO

## 2018-06-29 ENCOUNTER — Encounter (HOSPITAL_COMMUNITY): Payer: Self-pay

## 2018-07-05 ENCOUNTER — Inpatient Hospital Stay (HOSPITAL_COMMUNITY)
Admission: AD | Admit: 2018-07-05 | Discharge: 2018-07-22 | DRG: 786 | Disposition: A | Discharge status: Home / Self Care | Payer: BLUE CROSS/BLUE SHIELD | Attending: Family Medicine | Admitting: Family Medicine

## 2018-07-05 ENCOUNTER — Encounter (HOSPITAL_COMMUNITY): Payer: Self-pay | Admitting: Student

## 2018-07-05 ENCOUNTER — Other Ambulatory Visit: Payer: Self-pay

## 2018-07-05 ENCOUNTER — Ambulatory Visit (HOSPITAL_COMMUNITY)
Admission: RE | Admit: 2018-07-05 | Discharge: 2018-07-05 | Disposition: A | Discharge status: Home / Self Care | Payer: BLUE CROSS/BLUE SHIELD | Source: Ambulatory Visit | Attending: Obstetrics and Gynecology | Admitting: Obstetrics and Gynecology

## 2018-07-05 ENCOUNTER — Inpatient Hospital Stay (HOSPITAL_BASED_OUTPATIENT_CLINIC_OR_DEPARTMENT_OTHER): Payer: BLUE CROSS/BLUE SHIELD

## 2018-07-05 ENCOUNTER — Encounter (HOSPITAL_COMMUNITY): Payer: Self-pay

## 2018-07-05 DIAGNOSIS — Z3A33 33 weeks gestation of pregnancy: Secondary | ICD-10-CM

## 2018-07-05 DIAGNOSIS — O365930 Maternal care for other known or suspected poor fetal growth, third trimester, not applicable or unspecified: Secondary | ICD-10-CM | POA: Diagnosis present

## 2018-07-05 DIAGNOSIS — O4413 Placenta previa with hemorrhage, third trimester: Principal | ICD-10-CM | POA: Diagnosis present

## 2018-07-05 DIAGNOSIS — Z3A35 35 weeks gestation of pregnancy: Secondary | ICD-10-CM | POA: Diagnosis not present

## 2018-07-05 DIAGNOSIS — O359XX1 Maternal care for (suspected) fetal abnormality and damage, unspecified, fetus 1: Secondary | ICD-10-CM | POA: Diagnosis not present

## 2018-07-05 DIAGNOSIS — Z3A34 34 weeks gestation of pregnancy: Secondary | ICD-10-CM

## 2018-07-05 DIAGNOSIS — O365931 Maternal care for other known or suspected poor fetal growth, third trimester, fetus 1: Secondary | ICD-10-CM

## 2018-07-05 DIAGNOSIS — O4690 Antepartum hemorrhage, unspecified, unspecified trimester: Secondary | ICD-10-CM | POA: Diagnosis not present

## 2018-07-05 DIAGNOSIS — Z3A37 37 weeks gestation of pregnancy: Secondary | ICD-10-CM | POA: Diagnosis not present

## 2018-07-05 DIAGNOSIS — O4403 Placenta previa specified as without hemorrhage, third trimester: Secondary | ICD-10-CM

## 2018-07-05 DIAGNOSIS — O4693 Antepartum hemorrhage, unspecified, third trimester: Secondary | ICD-10-CM

## 2018-07-05 DIAGNOSIS — Z227 Latent tuberculosis: Secondary | ICD-10-CM | POA: Diagnosis present

## 2018-07-05 DIAGNOSIS — O4400 Placenta previa specified as without hemorrhage, unspecified trimester: Secondary | ICD-10-CM | POA: Diagnosis present

## 2018-07-05 DIAGNOSIS — O0993 Supervision of high risk pregnancy, unspecified, third trimester: Secondary | ICD-10-CM

## 2018-07-05 LAB — URINALYSIS, ROUTINE W REFLEX MICROSCOPIC

## 2018-07-05 LAB — CBC
HCT: 35.6 % — ABNORMAL LOW (ref 36.0–46.0)
Hemoglobin: 11.7 g/dL — ABNORMAL LOW (ref 12.0–15.0)
MCH: 28.8 pg (ref 26.0–34.0)
MCHC: 32.9 g/dL (ref 30.0–36.0)
MCV: 87.7 fL (ref 78.0–100.0)
PLATELETS: 210 10*3/uL (ref 150–400)
RBC: 4.06 MIL/uL (ref 3.87–5.11)
RDW: 13.3 % (ref 11.5–15.5)
WBC: 7.9 10*3/uL (ref 4.0–10.5)

## 2018-07-05 LAB — URINALYSIS, MICROSCOPIC (REFLEX): BACTERIA UA: NONE SEEN

## 2018-07-05 LAB — TYPE AND SCREEN
ABO/RH(D): O POS
Antibody Screen: NEGATIVE

## 2018-07-05 MED ORDER — BETAMETHASONE SOD PHOS & ACET 6 (3-3) MG/ML IJ SUSP
12.0000 mg | INTRAMUSCULAR | Status: AC
  Administered 2018-07-05 – 2018-07-06 (×2): 12 mg via INTRAMUSCULAR
  Filled 2018-07-05 (×2): qty 2

## 2018-07-05 MED ORDER — ZOLPIDEM TARTRATE 5 MG PO TABS: 5.0000 mg | ORAL_TABLET | Freq: Every evening | ORAL | Status: DC | PRN

## 2018-07-05 MED ORDER — ACETAMINOPHEN 325 MG PO TABS: 650.0000 mg | ORAL_TABLET | ORAL | Status: DC | PRN

## 2018-07-05 MED ORDER — DEXTROSE IN LACTATED RINGERS 5 % IV SOLN
INTRAVENOUS | Status: DC
  Administered 2018-07-05 – 2018-07-06 (×2): via INTRAVENOUS

## 2018-07-05 MED ORDER — CALCIUM CARBONATE ANTACID 500 MG PO CHEW
2.0000 | CHEWABLE_TABLET | ORAL | Status: DC | PRN
  Administered 2018-07-12: 400 mg via ORAL
  Filled 2018-07-05: qty 2

## 2018-07-05 MED ORDER — PRENATAL MULTIVITAMIN CH
1.0000 | ORAL_TABLET | Freq: Every day | ORAL | Status: DC
  Administered 2018-07-06 – 2018-07-19 (×12): 1 via ORAL
  Filled 2018-07-05 (×13): qty 1

## 2018-07-05 NOTE — MAU Note (Signed)
Pt presents to MAU with complaints of heavy vaginal bleeding that started around 1415 this evening. Pt had an ultrasound scheduled today at 1630 for follow up. Denies any pain

## 2018-07-05 NOTE — MAU Provider Note (Signed)
Chief Complaint:  Vaginal Bleeding   First Provider Initiated Contact with Patient 07/05/18 1457     HPI: Elizabeth Foley is a 32 y.o. G3P2002 at [redacted]w[redacted]d who presents to maternity admissions reporting vaginal bleeding. Current pregnancy complicated by placenta previa. Was supposed to have f/u ultrasound this afternoon to reassess placenta. Missed ultrasound appt d/t coming to MAU.  This is her first bleeding episode during the pregnancy. Bleeding started around 215 this afternoon. States she went through 2 pads. No clots. Denies abdominal pain, IC, fall, or hit to abdomen. Positive fetal movement.  [place] interpreter at the bedside.   Pregnancy Course:   Past Medical History:  Diagnosis Date  . Gastric ulcer   . TB lung, latent    neg cxr. rifampin x 71m 05/2016-10/2016   OB History  Gravida Para Term Preterm AB Living  3 2 2     2   SAB TAB Ectopic Multiple Live Births        0 2    # Outcome Date GA Lbr Len/2nd Weight Sex Delivery Anes PTL Lv  3 Current           2 Term 11/24/15 [redacted]w[redacted]d 03:50 / 00:19 8 lb 14 oz (4.026 kg) M Vag-Spont EPI  LIV  1 Term 06/04/13 [redacted]w[redacted]d 13:50 / 00:41 6 lb 10.3 oz (3.014 kg) F Vag-Spont EPI  LIV   Past Surgical History:  Procedure Laterality Date  . NO PAST SURGERIES     Family History  Problem Relation Age of Onset  . Hypertension Mother   . Alcohol abuse Neg Hx   . Arthritis Neg Hx   . Asthma Neg Hx   . Birth defects Neg Hx   . COPD Neg Hx   . Depression Neg Hx   . Diabetes Neg Hx   . Drug abuse Neg Hx   . Early death Neg Hx   . Hearing loss Neg Hx   . Heart disease Neg Hx   . Hyperlipidemia Neg Hx   . Kidney disease Neg Hx   . Learning disabilities Neg Hx   . Mental illness Neg Hx   . Mental retardation Neg Hx   . Miscarriages / Stillbirths Neg Hx   . Stroke Neg Hx   . Vision loss Neg Hx    Social History   Tobacco Use  . Smoking status: Never Smoker  . Smokeless tobacco: Never Used  Substance Use Topics  . Alcohol use: No  . Drug  use: No   No Known Allergies Medications Prior to Admission  Medication Sig Dispense Refill Last Dose  . Prenatal Vit-Fe Fumarate-FA (PRENATAL VITAMIN PO) Take by mouth.   Taking    I have reviewed patient's Past Medical Hx, Surgical Hx, Family Hx, Social Hx, medications and allergies.   ROS:  Review of Systems  Constitutional: Negative.   Gastrointestinal: Negative.   Genitourinary: Positive for vaginal bleeding.    Physical Exam   Patient Vitals for the past 24 hrs:  BP Temp Pulse Resp SpO2 Weight  07/05/18 1639 - - (!) 104 - 96 % -  07/05/18 1437 132/81 97.6 F (36.4 C) (!) 120 18 - 163 lb (73.9 kg)   Constitutional: Well-developed, well-nourished female in no acute distress.  Cardiovascular: normal rate Respiratory: normal effort GI: Abd soft, non-tender, gravid appropriate for gestational age. Pos BS x 4 MS: Extremities nontender, no edema, normal ROM Neurologic: Alert and oriented x 4.  GU:  Pelvic: NEFG, small amount of dark red blood.  Digital exam deferred. Cervix visually 1 cm dilated.     FHT:  Baseline 150 , moderate variability, accelerations present, no decelerations Contractions: x1   Labs: Results for orders placed or performed during the hospital encounter of 07/05/18 (from the past 24 hour(s))  Urinalysis, Routine w reflex microscopic     Status: Abnormal   Collection Time: 07/05/18  2:57 PM  Result Value Ref Range   Color, Urine RED (A) YELLOW   APPearance TURBID (A) CLEAR   Specific Gravity, Urine  1.005 - 1.030    TEST NOT REPORTED DUE TO COLOR INTERFERENCE OF URINE PIGMENT   pH  5.0 - 8.0    TEST NOT REPORTED DUE TO COLOR INTERFERENCE OF URINE PIGMENT   Glucose, UA (A) NEGATIVE mg/dL    TEST NOT REPORTED DUE TO COLOR INTERFERENCE OF URINE PIGMENT   Hgb urine dipstick (A) NEGATIVE    TEST NOT REPORTED DUE TO COLOR INTERFERENCE OF URINE PIGMENT   Bilirubin Urine (A) NEGATIVE    TEST NOT REPORTED DUE TO COLOR INTERFERENCE OF URINE PIGMENT    Ketones, ur (A) NEGATIVE mg/dL    TEST NOT REPORTED DUE TO COLOR INTERFERENCE OF URINE PIGMENT   Protein, ur (A) NEGATIVE mg/dL    TEST NOT REPORTED DUE TO COLOR INTERFERENCE OF URINE PIGMENT   Nitrite (A) NEGATIVE    TEST NOT REPORTED DUE TO COLOR INTERFERENCE OF URINE PIGMENT   Leukocytes, UA (A) NEGATIVE    TEST NOT REPORTED DUE TO COLOR INTERFERENCE OF URINE PIGMENT  Urinalysis, Microscopic (reflex)     Status: None   Collection Time: 07/05/18  2:57 PM  Result Value Ref Range   RBC / HPF >50 0 - 5 RBC/hpf   WBC, UA 6-10 0 - 5 WBC/hpf   Bacteria, UA NONE SEEN NONE SEEN   Squamous Epithelial / LPF 0-5 0 - 5    Imaging:  [place] Mfm Ob Follow Up  Result Date: 07/05/2018 ----------------------------------------------------------------------  OBSTETRICS REPORT                      (Signed Final 07/05/2018 04:45 pm) ---------------------------------------------------------------------- Patient Info  ID #:       [id number]                          D.O.B.:  Oct 02, 1986 (32 yrs)  Name:       Elizabeth Foley                       Visit Date: 07/05/2018 03:31 pm ---------------------------------------------------------------------- Performed By  Performed By:     Magnus Ivaneresa Jones           Secondary Phy.:   Judeth HornERIN Jian Hodgman                    RDMS, RVT                                                             CNM  Attending:        Noralee Spaceavi Shankar MD        Address:          [address]  Referred By:      [facility]       Location:         [facility] for                    [employer]  Ref. Address:     [facility]                    [address]                    [place], [place]                    [address] ---------------------------------------------------------------------- Orders   #  Description                                  Code   1  [place] MFM OB FOLLOW UP                         60454.09  ----------------------------------------------------------------------   #  Ordered By               Order #        Accession #    Episode #   1  Judeth Horn            [id number]      [id number]     [id number]  ---------------------------------------------------------------------- Indications   [redacted] weeks gestation of pregnancy                Z3A.33   Placenta previa with hemorrhage, third         O44.13   trimester   Fetal abnormality - other known or             O35.9XX0   suspected (   Maternal care for known or suspected poor      O36.5931   fetal growth, third trimester, fetus 1 (not   found)  ---------------------------------------------------------------------- OB History  Blood Type:            Height:  5'2"   Weight (lb):  158       BMI:  28.9  Gravidity:    3         Term:   2        Prem:   0        SAB:   0  TOP:          0       Ectopic:  0        Living: 2 ---------------------------------------------------------------------- Fetal Evaluation  Num Of Fetuses:     1  Fetal Heart         176  Rate(bpm):  Cardiac Activity:   Observed  Presentation:       Cephalic  Placenta:  Anterior previa  Amniotic Fluid  AFI FV:      Subjectively within normal limits  AFI Sum(cm)     %Tile       Largest Pocket(cm)  21.62           82          8.78  RUQ(cm)       RLQ(cm)       LUQ(cm)        LLQ(cm)  5.59          3.57          8.78           3.69 ---------------------------------------------------------------------- Biometry  BPD:      83.8  mm     G. Age:  33w 5d         47  %    CI:        75.17   %    70 - 86                                                          FL/HC:      20.7   %    19.4 - 21.8  HC:      306.6  mm     G. Age:  34w 1d         25  %    HC/AC:      1.03        0.96 - 1.11  AC:      298.8  mm     G. Age:  33w 6d         57  %    FL/BPD:     75.9   %    71 - 87  FL:       63.6  mm     G. Age:  32w 6d         20  %    FL/AC:       21.3   %    20 - 24  CER:      42.8  mm     G. Age:  36w 6d         85  %  CM:          5  mm  Est. FW:    2231  gm    4 lb 15 oz      56  % ---------------------------------------------------------------------- Gestational Age  LMP:           34w 6d        Date:  11/03/17                 EDD:   08/10/18  U/S Today:     33w 5d                                        EDD:   08/18/18  Best:          33w 5d     Det. By:  Previous Ultrasound      EDD:   08/18/18                                      (  03/25/18) ---------------------------------------------------------------------- Anatomy  Cranium:               Appears normal         Aortic Arch:            Appears normal  Cavum:                 Appears normal         Ductal Arch:            Appears normal  Ventricles:            Appears normal         Diaphragm:              Appears normal  Choroid Plexus:        Appears normal         Stomach:                Appears normal, left                                                                        sided  Cerebellum:            Appears normal         Abdomen:                Appears normal  Posterior Fossa:       Visualized             Abdominal Wall:         Not well visualized  Nuchal Fold:           Not applicable (>20    Cord Vessels:           Appears normal ([redacted]                         wks GA)                                        vessel cord)  Face:                  Appears normal         Kidneys:                Appear normal                         (orbits and profile)  Lips:                  Appears normal         Bladder:                Appears normal  Thoracic:              Appears normal         Spine:                  Appears normal  Heart:                 Appears normal  Upper Extremities:      Lt Previously seen                         (4CH, axis, and                         situs)  RVOT:                  Appears normal         Lower Extremities:      Appears normal  LVOT:                  Appears  normal  Other:  Right distal upper extremity seen. Technically difficult due to          advanced gestational age. ---------------------------------------------------------------------- Impression  Patient with placenta previa is admitted with vaginal bleeding.  A limited ultrasound study was performed. Amniotic fluid is  normal and good fetal activity is seen. Fetal growth is  appropriate for gestational age.  Placenta previa is seen and it covers the internal os. ---------------------------------------------------------------------- Recommendations  Follow-up scans as clinically indicated. ----------------------------------------------------------------------                  Noralee Space, MD Electronically Signed Final Report   07/05/2018 04:45 pm ----------------------------------------------------------------------    MAU Course: Orders Placed This Encounter  Procedures  . [place] MFM OB FOLLOW UP  . Urinalysis, Routine w reflex microscopic  . Urinalysis, Microscopic (reflex)  . CBC  . Diet NPO time specified Except for: Sips with Meds  . Bed rest with bathroom privileges  . Notify physician (specify)  . Vital signs  . Defer vaginal exam for vaginal bleeding or PROM <37 weeks  . SCDs  . Full code  . Type and screen [facility] [place]  . Fetal nonstress test  . Insert peripheral IV  . Admit to Inpatient (patient's expected length of stay will be greater than 2 midnights or inpatient only procedure)   Meds ordered this encounter  Medications  . betamethasone acetate-betamethasone sodium phosphate (CELESTONE) injection 12 mg  . dextrose 5 % in lactated ringers infusion  . acetaminophen (TYLENOL) tablet 650 mg  . zolpidem (AMBIEN) tablet 5 mg  . calcium carbonate (TUMS - dosed in mg elemental calcium) chewable tablet 400 mg of elemental calcium  . prenatal multivitamin tablet 1 tablet    MDM: Category 1 tracing Small amount of blood on exam O positive Ultrasound shows  persistent complete previa D/w Dr. Vergie Living. Will admit for monitoring and given antenatal steroids  Assessment: 1. [redacted] weeks gestation of pregnancy   2. Vaginal bleeding in pregnancy, third trimester   3. Placenta previa antepartum in third trimester   4. [redacted] weeks gestation of pregnancy   5. Poor fetal growth affecting management of mother in third trimester, fetus 1   6. Suspected fetal abnormality affecting management of mother, fetus 1     Plan: Admit BMZ ordered     Judeth Horn, NP 07/05/2018 4:49 PM

## 2018-07-05 NOTE — Progress Notes (Addendum)
G3P2 @ 34.[redacted] wksga. Here for vaginal bleeding that started 1415. Arrived for U/S appt 15 minutes prior, went to the bathroom and started bleeding. U/s dept advised pt to come to MAU.  [place] and interpreter at bs  1455: Provider at bs assessing. Pelvic exam done to assess bleeding. Noted some bleeding. U/S ordered   Hx: Placenta Previa with current pregnancy  BP 132/81   Pulse (!) 120   Temp 97.6 F (36.4 C)   Resp 18   Wt 163 lb (73.9 kg)   LMP 11/03/2017 (Exact Date)   BMI 29.81 kg/m    1634: EFM applied. Provider at bs reassessing and POC discussed with admission. Ordered for BMZ  1st dose BMZ given.  Labs and IV saline locked  1730 3rd floor notified report given. Room 321   1734: pt to 3rd via wheelchair.

## 2018-07-06 DIAGNOSIS — O365930 Maternal care for other known or suspected poor fetal growth, third trimester, not applicable or unspecified: Secondary | ICD-10-CM

## 2018-07-06 DIAGNOSIS — O4403 Placenta previa specified as without hemorrhage, third trimester: Secondary | ICD-10-CM

## 2018-07-06 NOTE — H&P (Signed)
Obstetrics Admission History & Physical  07/06/2018 - 9:28 AM Date of Admission: 07/05/2018  Primary OBGYN: Family medicine  Chief Complaint: vaginal bleeding, previa  History of Present Illness  32 y.o. [id number] @ [redacted]w[redacted]d, with the above CC. Pregnancy complicated by: above CC.  [name] states that she just had dark brown spotting overnight. No pain, decreased FM  Review of Systems:as noted in the History of Present Illness.  Patient Active Problem List   Diagnosis Date Noted  . Vaginal bleeding during pregnancy 07/05/2018  . Language barrier 06/02/2018  . Placenta previa 06/02/2018  . 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 Supervision of high risk pregnancy, antepartum, third trimester 04/27/2015    PMHx:  Past Medical History:  Diagnosis Date  . Gastric ulcer   . TB lung, latent    neg cxr. rifampin x 22m 05/2016-10/2016   PSHx:  Past Surgical History:  Procedure Laterality Date  . NO PAST SURGERIES     Medications:  Medications Prior to Admission  Medication Sig Dispense Refill Last Dose  . Prenatal Vit-Fe Fumarate-FA (PRENATAL VITAMIN PO) Take by mouth.   Taking     Allergies: has No Known Allergies. OBHx:  OB History  Gravida Para Term Preterm AB Living  3 2 2     2   SAB TAB Ectopic Multiple Live Births        0 2    # Outcome Date GA Lbr Len/2nd Weight Sex Delivery Anes PTL Lv  3 Current           2 Term 11/24/15 [redacted]w[redacted]d 03:50 / 00:19 8 lb 14 oz (4.026 kg) M Vag-Spont EPI  LIV  1 Term 06/04/13 [redacted]w[redacted]d 13:50 / 00:41 6 lb 10.3 oz (3.014 kg) F Vag-Spont EPI  LIV          FHx:  Family History  Problem Relation Age of Onset  . Hypertension Mother   . Alcohol abuse Neg Hx   . Arthritis Neg Hx   . Asthma Neg Hx   . Birth defects Neg Hx   . COPD Neg Hx   . Depression Neg Hx   . Diabetes Neg Hx   . Drug abuse Neg Hx   . Early death Neg Hx   . Hearing loss Neg Hx   . Heart disease Neg Hx   . Hyperlipidemia Neg Hx   . Kidney disease Neg Hx   . Learning  disabilities Neg Hx   . Mental illness Neg Hx   . Mental retardation Neg Hx   . Miscarriages / Stillbirths Neg Hx   . Stroke Neg Hx   . Vision loss Neg Hx    Soc Hx:  Social History   Socioeconomic History  . Marital status: Married    Spouse name: Elizabeth Foley  . Number of children: 0  . Years of education: 90  . Highest education level: Not on file  Occupational History  . Not on file  Social Needs  . Financial resource strain: Not on file  . Food insecurity:    Worry: Not on file    Inability: Not on file  . Transportation needs:    Medical: Not on file    Non-medical: Not on file  Tobacco Use  . Smoking status: Never Smoker  . Smokeless tobacco: Never Used  Substance and Sexual Activity  . Alcohol use: No  . Drug use: No  . Sexual activity: Yes    Partners: Male    Birth control/protection:  None  Lifestyle  . Physical activity:    Days per week: Not on file    Minutes per session: Not on file  . Stress: Not on file  Relationships  . Social connections:    Talks on phone: Not on file    Gets together: Not on file    Attends religious service: Not on file    Active member of club or organization: Not on file    Attends meetings of clubs or organizations: Not on file    Relationship status: Not on file  . Intimate partner violence:    Fear of current or ex partner: Not on file    Emotionally abused: Not on file    Physically abused: Not on file    Forced sexual activity: Not on file  Other Topics Concern  . Not on file  Social History Narrative  . Not on file    Objective     Current Vital Signs 24h Vital Sign Ranges  T 98 F (36.7 C) Temp  Avg: 98.2 F (36.8 C)  Min: 97.6 F (36.4 C)  Max: 98.6 F (37 C)  BP (!) 93/59 BP  Min: 93/59  Max: 132/81  HR 78 Pulse  Avg: 97.6  Min: 78  Max: 120  RR 16 Resp  Avg: 16.2  Min: 15  Max: 18  SaO2 95 % Room Air SpO2  Avg: 96.7 %  Min: 95 %  Max: 98 %       24 Hour I/O Current Shift I/O  Time Ins Outs  No intake/output data recorded. No intake/output data recorded.   Reactive NST, toco quiet. OBIX is down currently  General: Well nourished, well developed female in no acute distress.  Skin:  Warm and dry.  Cardiovascular: S1, S2 normal, no murmur, rub or gallop, regular rate and rhythm Respiratory:  Clear to auscultation bilateral. Normal respiratory effort Abdomen: gravid, nttp Neuro/Psych:  Normal mood and affect.   Labs  O POS  Recent Labs  Lab 07/05/18 1717  WBC 7.9  HGB 11.7*  HCT 35.6*  PLT 210     Radiology No new imaging. Normal growth, ac, afi yesterday with persistent anterior previa seen.   Assessment & Foley   32 y.o. [id number] @ 528w0d doing well *Pregnancy: routine care. Bid NST *Previa: pt hasn't gone to the bathroom today so unsure if any bleeding this morning. Pt told that will stay at least a week from last bleed.  *Preterm: BMZ #2 1645 today. Consult nicu prn *Analgesia: no needs *Dispo: see above.   Interpreter used.   Cornelia Copaharlie Alexandro Line, Jr. MD Attending Center for [facility](Faculty Practice)

## 2018-07-07 NOTE — Progress Notes (Signed)
Daily Antepartum Note  Admission Date: 07/05/2018 Current Date: 07/07/2018 11:57 AM  Elizabeth Foley is a 32 y.o. [id number] @ 7945w1d, HD#3, admitted for VB episode #1, anterior previa.  Pregnancy complicated by: Patient Active Problem List   Diagnosis Date Noted  . Vaginal bleeding during pregnancy 07/05/2018  . Language barrier 06/02/2018  . Placenta previa 06/02/2018  . 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 Supervision of high risk pregnancy, antepartum, third trimester 04/27/2015    Overnight/24hr events:  none  Subjective:  Still having some rare brown spotting, no pain, +FM, no PTL s/s.   Objective:    Current Vital Signs 24h Vital Sign Ranges  T 97.6 F (36.4 C) Temp  Avg: 98.4 F (36.9 C)  Min: 97.6 F (36.4 C)  Max: 98.8 F (37.1 C)  BP 96/65 BP  Min: 96/65  Max: 107/59  HR (!) 101 Pulse  Avg: 102.8  Min: 98  Max: 107  RR (!) 1 Resp  Avg: 13  Min: 1  Max: 16  SaO2 99 % Room Air SpO2  Avg: 97.5 %  Min: 95 %  Max: 99 %       24 Hour I/O Current Shift I/O  Time Ins Outs No intake/output data recorded. No intake/output data recorded.   145 baseline, +accels, no decel, mod variability ?one UC  Physical exam: General: Well nourished, well developed female in no acute distress. Abdomen: gravid nttp Cardiovascular: S1, S2 normal, no murmur, rub or gallop, regular rate and rhythm Respiratory: CTAB Extremities: no clubbing, cyanosis or edema Skin: Warm and dry.   Medications: Current Facility-Administered Medications  Medication Dose Route Frequency Provider Last Rate Last Dose  . acetaminophen (TYLENOL) tablet 650 mg  650 mg Oral Q4H PRN [place] BingPickens, Maisen Klingler, MD      . calcium carbonate (TUMS - dosed in mg elemental calcium) chewable tablet 400 mg of elemental calcium  2 tablet Oral Q4H PRN [place] BingPickens, Jazz Biddy, MD      . prenatal multivitamin tablet 1 tablet  1 tablet Oral Q1200 [place] BingPickens, Tirza Senteno, MD   1 tablet at 07/06/18 1138  . zolpidem (AMBIEN) tablet 5 mg  5 mg Oral QHS PRN  [place] BingPickens, Ladislaus Repsher, MD        Labs:  Recent Labs  Lab 07/05/18 1717  WBC 7.9  HGB 11.7*  HCT 35.6*  PLT 210   O POS  Radiology: no new imaging AFI 21.6, efw 2231gm, 56%, AC 57%  Assessment & Plan:  Pt doing well *Pregnancy:routine care. rNST *VB: keep in house at least until next Monday *Previa: pt aware that if any indication for delivery that she would need a c-section. Pt does NOT want a BTL *Preterm: NICU aware. S/p bmz on 7/22 and 23 *PPx: SCDs, OOB ad lib *FEN/GI: SLIV, regular diet *Dispo: see above  Interpreter used  Cornelia Copaharlie Harlem Bula, Jr. MD Attending Center for [facility](Faculty Practice)

## 2018-07-08 LAB — TYPE AND SCREEN
ABO/RH(D): O POS
Antibody Screen: NEGATIVE

## 2018-07-08 NOTE — Progress Notes (Signed)
Daily Antepartum Note  Admission Date: 07/05/2018 Current Date: 07/08/2018 10:24 AM  Elizabeth Foley is a 32 y.o. [id number] @ 6767w2d, HD#4, admitted for VB episode #1, anterior previa.  Pregnancy complicated by: Patient Active Problem List   Diagnosis Date Noted  . Vaginal bleeding during pregnancy 07/05/2018  . Language barrier 06/02/2018  . Placenta previa 06/02/2018  . 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 Supervision of high risk pregnancy, antepartum, third trimester 04/27/2015    Overnight/24hr events:  none  Subjective:  Rare brown d/c. No pain, +FM  Objective:    Current Vital Signs 24h Vital Sign Ranges  T 98.4 F (36.9 C) Temp  Avg: 98.1 F (36.7 C)  Min: 97.8 F (36.6 C)  Max: 98.4 F (36.9 C)  BP 100/71 BP  Min: 95/64  Max: 103/67  HR 86 Pulse  Avg: 92  Min: 85  Max: 102  RR 17 Resp  Avg: 17.3  Min: 16  Max: 20  SaO2 98 % Room Air SpO2  Avg: 98.2 %  Min: 97 %  Max: 99 %       24 Hour I/O Current Shift I/O  Time Ins Outs No intake/output data recorded. No intake/output data recorded.   135 baseline, +accels, no decel, mod variability [place]  Physical exam: General: Well nourished, well developed female in no acute distress. Abdomen: gravid nttp Cardiovascular: S1, S2 normal, no murmur, rub or gallop, regular rate and rhythm Respiratory: CTAB Extremities: no clubbing, cyanosis or edema Skin: Warm and dry.   Old pad examined and about a quarter sized spot of slightly old/dark brown (very dilute) d/c on pad.   Medications: Current Facility-Administered Medications  Medication Dose Route Frequency Provider Last Rate Last Dose  . acetaminophen (TYLENOL) tablet 650 mg  650 mg Oral Q4H PRN [place] BingPickens, Ayaz Sondgeroth, MD      . calcium carbonate (TUMS - dosed in mg elemental calcium) chewable tablet 400 mg of elemental calcium  2 tablet Oral Q4H PRN [place] BingPickens, Tiera Mensinger, MD      . prenatal multivitamin tablet 1 tablet  1 tablet Oral Q1200 [place] BingPickens, Jaquail Mclees, MD   1 tablet at 07/07/18 1220   . zolpidem (AMBIEN) tablet 5 mg  5 mg Oral QHS PRN [place] BingPickens, Giovoni Bunch, MD        Labs:  Recent Labs  Lab 07/05/18 1717  WBC 7.9  HGB 11.7*  HCT 35.6*  PLT 210   O POS  Radiology: no new imaging AFI 21.6, efw 2231gm, 56%, AC 57%  Assessment & Plan:  Pt doing well *Pregnancy:routine care. Reactive NST *VB: keep in house at least until next Monday *Previa: pt aware that if any indication for delivery that she would need a c-section. Pt does NOT want a BTL *Preterm: NICU aware. S/p bmz on 7/22 and 23 *PPx: SCDs, OOB ad lib *FEN/GI: SLIV, regular diet *Dispo: see above  Interpreter used  Cornelia Copaharlie Leavy Heatherly, Jr. MD Attending Center for [facility](Faculty Practice)

## 2018-07-09 NOTE — Progress Notes (Signed)
Daily Antepartum Note  Admission Date: 07/05/2018 Current Date: 07/09/2018 10:26 AM  Elizabeth Foley is a 32 y.o. [id number] @ 2954w3d, HD#5, admitted for VB episode #1, anterior previa.  Pregnancy complicated by: Patient Active Problem List   Diagnosis Date Noted  . Vaginal bleeding during pregnancy 07/05/2018  . Language barrier 06/02/2018  . Placenta previa 06/02/2018  . 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 Supervision of high risk pregnancy, antepartum, third trimester 04/27/2015    Overnight/24hr events:  none  Subjective:  No VB, spotting or discharge yesterday. No ptl or decreased FM s/s  Objective:    Current Vital Signs 24h Vital Sign Ranges  T 98.3 F (36.8 C) Temp  Avg: 97.9 F (36.6 C)  Min: 97.7 F (36.5 C)  Max: 98.3 F (36.8 C)  BP 98/69 BP  Min: 89/71  Max: 102/82  HR 70 Pulse  Avg: 94.4  Min: 70  Max: 108  RR 14 Resp  Avg: 17  Min: 14  Max: 18  SaO2 99 % Room Air SpO2  Avg: 98.2 %  Min: 97 %  Max: 99 %       24 Hour I/O Current Shift I/O  Time Ins Outs No intake/output data recorded. No intake/output data recorded.   135 baseline, +accels, no decel, mod variability [place]: quiet  Physical exam: General: Well nourished, well developed female in no acute distress. Abdomen: gravid nttp Cardiovascular: S1, S2 normal, no murmur, rub or gallop, regular rate and rhythm Respiratory: CTAB Extremities: no clubbing, cyanosis or edema Skin: Warm and dry.   Medications: Current Facility-Administered Medications  Medication Dose Route Frequency Provider Last Rate Last Dose  . acetaminophen (TYLENOL) tablet 650 mg  650 mg Oral Q4H PRN [place] BingPickens, Cleota Pellerito, MD      . calcium carbonate (TUMS - dosed in mg elemental calcium) chewable tablet 400 mg of elemental calcium  2 tablet Oral Q4H PRN [place] BingPickens, Infant Zink, MD      . prenatal multivitamin tablet 1 tablet  1 tablet Oral Q1200 [place] BingPickens, Jacyln Carmer, MD   1 tablet at 07/08/18 1222  . zolpidem (AMBIEN) tablet 5 mg  5 mg Oral QHS PRN [place] BingPickens,  Shelbe Haglund, MD        Labs:  Recent Labs  Lab 07/05/18 1717  WBC 7.9  HGB 11.7*  HCT 35.6*  PLT 210   O POS  Radiology: no new imaging AFI 21.6, efw 2231gm, 56%, AC 57%  Assessment & Plan:  Pt doing well *Pregnancy:routine care. Reactive NST *VB: discussed in group rounds and recommend keeping inpatient until 37/0 wks and then delivery. If bleeds again, then proceed with delivery. D/w pt and she is amenable to plan.  *Previa: pt aware that if any indication for delivery that she would need a c-section. Pt does NOT want a BTL *Preterm: NICU aware. S/p bmz on 7/22 and 23 *PPx: SCDs, OOB ad lib *FEN/GI: SLIV, regular diet *Dispo: see above  Interpreter used  Cornelia Copaharlie Kashmere Staffa, Jr. MD Attending Center for [facility](Faculty Practice)

## 2018-07-10 NOTE — Progress Notes (Signed)
Patient ID: Elizabeth Foley, female   DOB: June 03, 1986, 32 y.o.   MRN: [id number] ACULTY PRACTICE ANTEPARTUM COMPREHENSIVE PROGRESS NOTE  Elizabeth Foley is a 32 y.o. G3P2002 at 6267w4d HD # 6 who is admitted for VB episode # 1, anterior previa Fetal presentation is cephalic on 07/05/18 U/S Length of Stay:  5  Days  Subjective: Pt is without complaints this morning. Ambulating and voiding without problems. Tolerating diet. + FM. Denis ut ctx or VB.   Vitals:  Blood pressure 101/67, pulse 96, temperature 98.1 F (36.7 C), temperature source Oral, resp. rate 15, height 5\' 4"  (1.626 m), weight 73.9 kg (163 lb), last menstrual period 11/03/2017, SpO2 98 %, unknown if currently breastfeeding.   Physical Examination: Lungs clear Heart RRR Abd soft + BS gravid non tender Ext trace edema non tender  Fetal Monitoring:  120-130's , + Accels  Labs:  No results found for this or any previous visit (from the past 24 hour(s)).  Imaging Studies:    none   Medications:  Scheduled . prenatal multivitamin  1 tablet Oral Q1200   I have reviewed the patient's current medications.  ASSESSMENT: IUP 35 4/7 weeks VB episode # 1, anterior previa, S/P BMX x 2  PLAN: Stable. Plan is to remain in hospital until delivery at 37 weeks vis c section. Continue routine antenatal care.   Hermina StaggersMichael L Yael Coppess 07/10/2018,9:18 AM

## 2018-07-11 ENCOUNTER — Other Ambulatory Visit: Payer: Self-pay | Admitting: Family Medicine

## 2018-07-11 NOTE — Progress Notes (Signed)
Patient ID: Elizabeth Foley, female   DOB: 03-11-1986, 32 y.o.   MRN: [id number] ACULTY PRACTICE ANTEPARTUM COMPREHENSIVE PROGRESS NOTE  Elizabeth Foley is a 32 y.o. G3P2002 at 345w5d  who is admitted for vaginal bleeding, episode # 1.   Fetal presentation is cephalic. Length of Stay:  6  Days  Subjective: Pt without complaints except hard to sleep at night. Denies vaginal bleeding, LOF, ut ctx or cramps. + FM. Tolerating diet, voiding without problems.  Vitals:  Blood pressure 102/75, pulse 97, temperature 98 F (36.7 C), temperature source Oral, resp. rate 18, height 5\' 4"  (1.626 m), weight 73.9 kg (163 lb), last menstrual period 11/03/2017, SpO2 98 %, unknown if currently breastfeeding.   Physical Examination: Lungs clear  Heart RRR Abd soft + BS gravid non tender Ext non tender  Fetal Monitoring:  130-140's, + accels  Labs:  No results found for this or any previous visit (from the past 24 hour(s)).  Imaging Studies:    none   Medications:  Scheduled . prenatal multivitamin  1 tablet Oral Q1200   I have reviewed the patient's current medications.  ASSESSMENT: IUP 35 6/7 weeks Vaginal bleeding episode # 1, anterior previa, s/p BMZ x 2  PLAN: Stable. Fetal well being reassuring. Remain in hospital until delivery via c section at 37 weeks or unless indicated sooner.  Continue routine antenatal care.   Hermina StaggersMichael L Daneshia Tavano 07/11/2018,7:08 AM

## 2018-07-11 NOTE — Plan of Care (Signed)
  Problem: Physical Regulation: Goal: Complications related to the disease process, condition or treatment will be avoided or minimized Outcome: Progressing   Problem: Safety: Goal: Ability to remain free from injury will improve Outcome: Progressing   

## 2018-07-12 DIAGNOSIS — Z3A35 35 weeks gestation of pregnancy: Secondary | ICD-10-CM

## 2018-07-12 NOTE — Progress Notes (Signed)
Patient ID: Elizabeth Foley, female   DOB: 09-Jan-1986, 32 y.o.   MRN: [id number]  FACULTY PRACTICE ANTEPARTUM NOTE  Elizabeth Foley is a 32 y.o. G3P2002 at 5426w6d  who is admitted for vaginal bleeding in setting of placenta previa.   Fetal presentation is cephalic. Length of Stay:  7  Days  Subjective: Brown discharge. No bright red blood.  Patient reports good fetal movement.   She reports no uterine contractions She reports no bleeding  She reports no loss of fluid per vagina.  Vitals:  Blood pressure 99/70, pulse 96, temperature 98.6 F (37 C), temperature source Oral, resp. rate 14, height 5\' 4"  (1.626 m), weight 163 lb (73.9 kg), last menstrual period 11/03/2017, SpO2 95 %, unknown if currently breastfeeding. Physical Examination:  General appearance - alert, well appearing, and in no distress Chest - clear to auscultation, no wheezes, rales or rhonchi, symmetric air entry Heart - normal rate, regular rhythm, normal S1, S2, no murmurs, rubs, clicks or gallops Abdomen - soft, nontender, nondistended, no masses or organomegaly Fundal Height:  size equals dates Extremities: extremities normal, atraumatic, no cyanosis or edema and Homans sign is negative, no sign of DVT  Membranes:intact  Fetal Monitoring:  Baseline: 155 bpm, Variability: Good {> 6 bpm), Accelerations: Reactive and Decelerations: Absent x3  Labs:  No results found for this or any previous visit (from the past 24 hour(s)).  Imaging Studies:       Medications:  Scheduled . prenatal multivitamin  1 tablet Oral Q1200   I have reviewed the patient's current medications.  ASSESSMENT: Active Problems:   Vaginal bleeding during pregnancy   PLAN: 1. Vaginal bleeding 2. Placenta previa 3. [redacted] weeks pregnant Continue to monitor inpatient until delivery.  Continue routine antenatal care.   Levie HeritageStinson, Piedad Standiford J, DO 07/12/2018,11:17 AM

## 2018-07-13 NOTE — Progress Notes (Signed)
Patient ID: Elizabeth Foley, female   DOB: Jul 02, 1986, 32 y.o.   MRN: [id number]  FACULTY PRACTICE ANTEPARTUM NOTE  Elizabeth Foley is a 32 y.o. G3P2002 at 7969w6d  who is admitted for vaginal bleeding in setting of placenta previa.   Fetal presentation is cephalic. Length of Stay:  8  Days  Subjective: Brown discharge. No bright red blood. No new complaints Patient reports good fetal movement.   She reports no uterine contractions She reports no bleeding  She reports no loss of fluid per vagina.  Vitals:  Blood pressure 101/77, pulse (!) 109, temperature 98.3 F (36.8 C), temperature source Oral, resp. rate 18, height 5\' 4"  (1.626 m), weight 163 lb (73.9 kg), last menstrual period 11/03/2017, SpO2 100 %, unknown if currently breastfeeding. Physical Examination:  General appearance - alert, well appearing, and in no distress Chest - clear to auscultation, good respiratory effort. Heart - normal rate, regular rhythm, no murmurs, rubs, clicks or gallops Abdomen - soft, nontender, nondistended Fundal Height:  size equals dates Extremities: extremities normal, atraumatic, no cyanosis or edema and Homans sign is negative, no sign of DVT  Membranes:intact  Fetal Monitoring:  Baseline: 155 bpm, Variability: Good {> 6 bpm), Accelerations: Reactive and Decelerations: Absent x2  Labs:  No results found for this or any previous visit (from the past 24 hour(s)).  Imaging Studies:       Medications:  Scheduled . prenatal multivitamin  1 tablet Oral Q1200   I have reviewed the patient's current medications.  ASSESSMENT: Active Problems:   Vaginal bleeding during pregnancy   PLAN: 1. Vaginal bleeding 2. Placenta previa 3. [redacted] weeks pregnant Continue to monitor inpatient until delivery. Delivery if has rebleed or for fetal indications.  Continue routine antenatal care.   Levie HeritageStinson, Lien Lyman J, DO 07/13/2018,11:22 AM

## 2018-07-14 ENCOUNTER — Encounter: Payer: BLUE CROSS/BLUE SHIELD | Admitting: Obstetrics and Gynecology

## 2018-07-14 NOTE — Progress Notes (Signed)
Patient ID: Elizabeth Foley, female   DOB: 07-16-1986, 32 y.o.   MRN: [id number]  FACULTY PRACTICE ANTEPARTUM NOTE  Elizabeth Foley is a 32 y.o. G3P2002 at 6742w1d  who is admitted for vaginal bleeding, placenta previa.   Fetal presentation is cephalic. Length of Stay:  9  Days  Subjective: Patient having brown discharge. No active bleeding. Patient reports good fetal movement.   She reports no uterine contractions She reports no bleeding  She reports no loss of fluid per vagina.  Vitals:  Blood pressure 101/69, pulse (!) 103, temperature 98 F (36.7 C), temperature source Oral, resp. rate 17, height 5\' 4"  (1.626 m), weight 163 lb (73.9 kg), last menstrual period 11/03/2017, SpO2 100 %, unknown if currently breastfeeding. Physical Examination:  General appearance - alert, well appearing, and in no distress Heart - normal rate, regular rhythm, normal S1, S2, no murmurs, rubs, clicks or gallops Abdomen - soft, nontender, nondistended, no masses or organomegaly Fundal Height:  size equals dates Extremities: extremities normal, atraumatic, no cyanosis or edema  Membranes:intact  Fetal Monitoring:  Baseline: 150s bpm, Variability: Good {> 6 bpm), Accelerations: Reactive and Decelerations: Absent  Labs:  No results found for this or any previous visit (from the past 24 hour(s)).  Imaging Studies:       Medications:  Scheduled . prenatal multivitamin  1 tablet Oral Q1200   I have reviewed the patient's current medications.  ASSESSMENT: Active Problems:   Vaginal bleeding during pregnancy   PLAN: 1. Placenta previa Currently no bleeding. Discussed possibility of delivering sooner than planned as acceptable delivery between 36-37 weeks. Risks discussed with patient, particularly risk of NICU admission due to breathing problems. Patient did get BMZ, so risk should be less. Patient will think about this and discuss later tonight.  Continue routine antenatal care.   Levie HeritageStinson, Deajah Erkkila J,  DO 07/14/2018,12:59 PM

## 2018-07-14 NOTE — Progress Notes (Signed)
Initial Nutrition Assessment  DOCUMENTATION CODES:  Not applicable  INTERVENTION:  Regular diet May order double protein portions, snacks TID and from retail  NUTRITION DIAGNOSIS:  Increased nutrient needs related to (pregnancy and fetal growth requirements) as evidenced by (36 weeks IUP).  GOAL:  Patient will meet greater than or equal to 90% of their needs MONITOR:  Weight trends REASON FOR ASSESSMENT:  Antenatal   ASSESSMENT:  36 weeks, adm due to vaginal bleeding/ placenta previa. no recorded pre-preg weight. Wt at 30 weeks 154 lbs- likely with adeq overall weight gain. wt up 9 lbs in 5 weeks   Diet Order:   Diet Order           Diet regular Room service appropriate? Yes; Fluid consistency: Thin  Diet effective now         EDUCATION NEEDS:   No education needs have been identified at this time  Skin:  Skin Assessment: Reviewed RN Assessment  Height:   Ht Readings from Last 1 Encounters:  07/06/18 5\' 4"  (1.626 m)    Weight:   Wt Readings from Last 1 Encounters:  07/06/18 163 lb (73.9 kg)    Ideal Body Weight:   120 lbs  BMI:  Body mass index is 27.98 kg/m.  Estimated Nutritional Needs:   Kcal:  1700-1900  Protein:  75-85 g  Fluid:  2 L    Elizabeth Foley M.Odis LusterEd. R.D. LDN Neonatal Nutrition Support Specialist/RD III Pager [phone]      Phone [phone]

## 2018-07-15 DIAGNOSIS — Z3A34 34 weeks gestation of pregnancy: Secondary | ICD-10-CM

## 2018-07-15 NOTE — Progress Notes (Signed)
Patient ID: Elizabeth Foley, female   DOB: 05-26-1986, 32 y.o.   MRN: [id number]  FACULTY PRACTICE ANTEPARTUM NOTE  Elizabeth Foley is a 32 y.o. G3P2002 at 3480w2d  who is admitted for vaginal bleeding, placenta previa.   Fetal presentation is cephalic. Length of Stay:  10  Days  Subjective: Patient having brown discharge. No active bleeding. Patient reports good fetal movement.   She reports no uterine contractions She reports no bleeding  She reports no loss of fluid per vagina.  Vitals:  Blood pressure 115/75, pulse (!) 107, temperature 97.6 F (36.4 C), temperature source Oral, resp. rate 18, height 5\' 4"  (1.626 m), weight 163 lb (73.9 kg), last menstrual period 11/03/2017, SpO2 97 %, unknown if currently breastfeeding. Physical Examination:  General appearance - alert, well appearing, and in no distress Heart - normal rate, regular rhythm, normal S1, S2, no murmurs, rubs, clicks or gallops Abdomen - soft, nontender, nondistended, no masses or organomegaly Fundal Height:  size equals dates Extremities: extremities normal, atraumatic, no cyanosis or edema  Membranes:intact  Fetal Monitoring: NST x 2 reviewed  Baseline: 150s bpm, Variability: Good {> 6 bpm), Accelerations: Reactive and Decelerations: Absent  Labs:  No results found for this or any previous visit (from the past 24 hour(s)).  Imaging Studies:       Medications:  Scheduled . prenatal multivitamin  1 tablet Oral Q1200   I have reviewed the patient's current medications.  ASSESSMENT: Active Problems:   Vaginal bleeding during pregnancy   PLAN: 1. Placenta previa Currently no bleeding. Patient decided to wait until 37 weeks for delivery to reduce risk of NICU admission Continue inpatient monitoring. NST reactive x2 Continue routine antenatal care.   Levie HeritageStinson, Jacob J, DO 07/15/2018,12:27 PM

## 2018-07-16 LAB — TYPE AND SCREEN
ABO/RH(D): O POS
Antibody Screen: NEGATIVE

## 2018-07-16 MED ORDER — FAMOTIDINE 20 MG PO TABS
20.0000 mg | ORAL_TABLET | Freq: Two times a day (BID) | ORAL | Status: DC
  Administered 2018-07-16 – 2018-07-19 (×8): 20 mg via ORAL
  Filled 2018-07-16 (×8): qty 1

## 2018-07-16 NOTE — Progress Notes (Signed)
Patient ID: Elizabeth Foley, female   DOB: Mar 18, 1986, 32 y.o.   MRN: [id number]  FACULTY PRACTICE ANTEPARTUM NOTE  Elizabeth Foley is a 32 y.o. G3P2002 at 6262w3d  who is admitted for bleeding.   Fetal presentation is cephalic. Length of Stay:  11  Days  Subjective: No active bleeding, still having some black/brown discharge. C/o heartburn Patient reports good fetal movement.   She reports no uterine contractions She reports no bleeding  She reports no loss of fluid per vagina.  Vitals:  Blood pressure 105/69, pulse 96, temperature 98.1 F (36.7 C), temperature source Oral, resp. rate 16, height 5\' 4"  (1.626 m), weight 163 lb (73.9 kg), last menstrual period 11/03/2017, SpO2 99 %, unknown if currently breastfeeding. Physical Examination:  General appearance - alert, well appearing, and in no distress Chest - clear to auscultation, no wheezes, rales or rhonchi, symmetric air entry Heart - normal rate, regular rhythm, normal S1, S2, no murmurs, rubs, clicks or gallops Fundal Height:  size equals dates Extremities: extremities normal, atraumatic, no cyanosis or edema and Homans sign is negative, no sign of DVT  Membranes:intact  Fetal Monitoring:  Baseline: 140s bpm, Variability: Good {> 6 bpm), Accelerations: Reactive and Decelerations: Absent  Labs:  Results for orders placed or performed during the hospital encounter of 07/05/18 (from the past 24 hour(s))  Type and screen [facility]   Collection Time: 07/16/18  5:45 AM  Result Value Ref Range   ABO/RH(D) O POS    Antibody Screen NEG    Sample Expiration      07/19/2018 Performed at [facility], [address] [place], [place] [address]     Imaging Studies:       Medications:  Scheduled . famotidine  20 mg Oral BID  . prenatal multivitamin  1 tablet Oral Q1200   I have reviewed the patient's current medications.  ASSESSMENT: Active Problems:   Vaginal bleeding during pregnancy   PLAN: 1. Vaginal  bleeding 2. Placenta previa Inpatient monitoring until delivery at 37 weeks NST reactive Continue routine antenatal care.   Levie HeritageStinson, Arletha Marschke J, DO 07/16/2018,11:41 AM

## 2018-07-17 NOTE — Progress Notes (Signed)
Patient ID: Elizabeth Foley, female   DOB: 11-11-1986, 32 y.o.   MRN: [id number] FACULTY PRACTICE ANTEPARTUM(COMPREHENSIVE) NOTE  Elizabeth Foley is a 32 y.o. G3P2002 at 4088w4d by best clinical estimate who is admitted for placenta previa and bleeding.   Fetal presentation is cephalic. Length of Stay:  12  Days  Subjective: Small amount of brown discharge Patient reports the fetal movement as active. Patient reports uterine contraction  activity as none. Patient reports  vaginal bleeding as scant staining. Patient describes fluid per vagina as None.  Vitals:  Blood pressure 101/76, pulse 93, temperature 98.4 F (36.9 C), temperature source Oral, resp. rate 16, height 5\' 4"  (1.626 m), weight 73.9 kg (163 lb), last menstrual period 11/03/2017, SpO2 100 %, unknown if currently breastfeeding. Physical Examination:  General appearance - alert, well appearing, and in no distress Heart - normal rate and regular rhythm Abdomen - soft, nontender, nondistended Fundal Height:  size equals dates Cervical Exam: Not evaluated.. Extremities: extremities normal, atraumatic, no cyanosis or edema and Homans sign is negative, no sign of DVT  Membranes:intact Fetal Heart Rate A  Mode External  [removed] filed at 07/16/2018 2019  Baseline Rate (A) 170 bpm filed at 07/16/2018 2019  Variability 6-25 BPM filed at 07/16/2018 2019  Accelerations 15 x 15 filed at 07/16/2018 2019  Decelerations None filed at 07/16/2018 2019    Fetal Monitoring:  NST reviewed,   Labs:  No results found for this or any previous visit (from the past 24 hour(s)).    Medications:  Scheduled . famotidine  20 mg Oral BID  . prenatal multivitamin  1 tablet Oral Q1200   I have reviewed the patient's current medications.  ASSESSMENT: Patient Active Problem List   Diagnosis Date Noted  . Vaginal bleeding during pregnancy 07/05/2018  . Language barrier 06/02/2018  . Placenta previa 06/02/2018  . 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  Supervision of high risk pregnancy, antepartum, third trimester 04/27/2015    PLAN: Continue hospital observation for recurrent bleeding until delivery scheduled 07/20/18  Scheryl DarterJames Briteny Fulghum 07/17/2018,7:23 AM

## 2018-07-18 DIAGNOSIS — O4690 Antepartum hemorrhage, unspecified, unspecified trimester: Secondary | ICD-10-CM

## 2018-07-18 NOTE — Progress Notes (Signed)
Patient ID: Elizabeth Bouchegoc Syfert, female   DOB: 1986/05/14, 32 y.o.   MRN: [id number] FACULTY PRACTICE ANTEPARTUM(COMPREHENSIVE) NOTE  Elizabeth Foley is a 32 y.o. G3P2002 at 7034w5d by best clinical estimate who is admitted for placenta previa and bleeding.   Fetal presentation is cephalic. Length of Stay:  13  Days  Subjective: Patient reports light brown discharge Patient reports the fetal movement as active. Patient reports uterine contraction  activity as none. Patient reports  vaginal bleeding as scant staining. Patient describes fluid per vagina as None.  Vitals:  Blood pressure 108/72, pulse 99, temperature 98.3 F (36.8 C), resp. rate 16, height 5\' 4"  (1.626 m), weight 163 lb (73.9 kg), last menstrual period 11/03/2017, SpO2 98 %, unknown if currently breastfeeding. Physical Examination:  General appearance - alert, well appearing, and in no distress Heart - normal rate and regular rhythm Abdomen - soft, nontender, nondistended Fundal Height:  size equals dates Cervical Exam: Not evaluated.. Extremities: extremities normal, atraumatic, no cyanosis or edema and Homans sign is negative, no sign of DVT  Membranes:intact  Fetal Monitoring:  Baseline: 145 bpm, Variability: Good {> 6 bpm), Accelerations: Reactive and Decelerations: Absent  Labs:  No results found for this or any previous visit (from the past 24 hour(s)).    Medications:  Scheduled . famotidine  20 mg Oral BID  . prenatal multivitamin  1 tablet Oral Q1200   I have reviewed the patient's current medications.  ASSESSMENT: Patient Active Problem List   Diagnosis Date Noted  . Vaginal bleeding during pregnancy 07/05/2018  . Language barrier 06/02/2018  . Placenta previa 06/02/2018  . 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 Supervision of high risk pregnancy, antepartum, third trimester 04/27/2015    PLAN: Vaginal bleeding stable Continue hospital observation for recurrent bleeding until delivery. Patient  scheduled for cesarean  section on 07/20/18  Jennifr Gaeta 07/18/2018,6:23 AM

## 2018-07-19 ENCOUNTER — Encounter (HOSPITAL_COMMUNITY)
Admission: RE | Admit: 2018-07-19 | Discharge: 2018-07-19 | Disposition: A | Discharge status: Home / Self Care | Payer: BLUE CROSS/BLUE SHIELD | Source: Ambulatory Visit | Attending: Family Medicine | Admitting: Family Medicine

## 2018-07-19 NOTE — Anesthesia Preprocedure Evaluation (Addendum)
Anesthesia Evaluation  Patient identified by MRN, date of birth, ID band Patient awake    Reviewed: Allergy & Precautions, NPO status , Patient's Chart, lab work & pertinent test results  Airway Mallampati: II  TM Distance: >3 FB Neck ROM: Full    Dental no notable dental hx. (+) Teeth Intact, Dental Advisory Given   Pulmonary neg pulmonary ROS,    Pulmonary exam normal breath sounds clear to auscultation       Cardiovascular Exercise Tolerance: Good Normal cardiovascular exam Rhythm:Regular Rate:Normal     Neuro/Psych negative neurological ROS  negative psych ROS   GI/Hepatic Neg liver ROS, PUD,   Endo/Other  negative endocrine ROS  Renal/GU negative Renal ROS     Musculoskeletal   Abdominal   Peds  Hematology negative hematology ROS (+)   Anesthesia Other Findings   Reproductive/Obstetrics (+) Pregnancy Pr for C/S w hx of placenta Previa                           Anesthesia Physical Anesthesia Plan  ASA: II  Anesthesia Plan: Spinal   Post-op Pain Management:    Induction:   PONV Risk Score and Plan: Treatment may vary due to age or medical condition  Airway Management Planned: Mask, Natural Airway and Nasal Cannula  Additional Equipment:   Intra-op Plan:   Post-operative Plan:   Informed Consent: I have reviewed the patients History and Physical, chart, labs and discussed the procedure including the risks, benefits and alternatives for the proposed anesthesia with the patient or authorized representative who has indicated his/her understanding and acceptance.     Plan Discussed with: CRNA and Anesthesiologist  Anesthesia Plan Comments: (Hx taken with help of [employer] [place] translator : Tim. Type and cross for 2 units at outset of case. )      Anesthesia Quick Evaluation

## 2018-07-19 NOTE — Progress Notes (Signed)
Patient ID: Elizabeth Foley, female   DOB: Aug 03, 1986, 32 y.o.   MRN: [id number] Elizabeth Foley is a 32 y.o. G3P2002 at 6656w6d by best clinical estimate who is admitted for placenta previa and bleeding.   Fetal presentation is cephalic. Length of Stay:  14  Days  Subjective: Small amount of brown discharge Patient reports the fetal movement as active. Patient reports uterine contraction  activity as none. Patient reports  vaginal bleeding as scant staining. Patient describes fluid per vagina as None.  Vitals:   07/18/18 2311 07/19/18 0823  BP:  103/76  Pulse: (!) 116 (!) 105  Resp: 16 16  Temp:  98.5 F (36.9 C)  SpO2: 99% 99%    Physical Examination:  General appearance - alert, well appearing, and in no distress Heart - normal rate and regular rhythm Abdomen - soft, nontender, nondistended Fundal Height:  size equals dates Cervical Exam: Not evaluated.. Extremities: extremities normal, atraumatic, no cyanosis or edema and Homans sign is negative, no sign of DVT  Membranes:intact      Fetal Monitoring:  NST reviewed,  Fetal Heart Rate A  Mode External filed at 07/18/2018 2200  Baseline Rate (A) 150 bpm filed at 07/18/2018 2200  Variability 6-25 BPM filed at 07/18/2018 2200  Accelerations 15 x 15 filed at 07/18/2018 2200  Decelerations None filed at 07/18/2018 2200     Labs:  No results found for this or any previous visit (from the past 24 hour(s)).    Medications:  Scheduled . famotidine  20 mg Oral BID  . prenatal multivitamin  1 tablet Oral Q1200   I have reviewed the patient's current medications.  ASSESSMENT:     Patient Active Problem List   Diagnosis Date Noted  . Vaginal bleeding during pregnancy 07/05/2018  . Language barrier 06/02/2018  . Placenta previa 06/02/2018  . 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 Supervision of high risk pregnancy, antepartum, third trimester 04/27/2015    PLAN: Continue hospital observation for recurrent bleeding until  delivery scheduled 07/20/18  Adam PhenixArnold, James G, MD 07/19/2018 9:39 AM

## 2018-07-20 ENCOUNTER — Encounter (HOSPITAL_COMMUNITY)
Admission: AD | Disposition: A | Discharge status: Home / Self Care | Payer: Self-pay | Source: Home / Self Care | Attending: Family Medicine

## 2018-07-20 ENCOUNTER — Inpatient Hospital Stay (HOSPITAL_COMMUNITY): Payer: BLUE CROSS/BLUE SHIELD | Admitting: Anesthesiology

## 2018-07-20 ENCOUNTER — Encounter (HOSPITAL_COMMUNITY): Payer: Self-pay | Admitting: Anesthesiology

## 2018-07-20 ENCOUNTER — Inpatient Hospital Stay (HOSPITAL_COMMUNITY)
Admission: RE | Admit: 2018-07-20 | Payer: BLUE CROSS/BLUE SHIELD | Source: Ambulatory Visit | Admitting: Family Medicine

## 2018-07-20 DIAGNOSIS — O4403 Placenta previa specified as without hemorrhage, third trimester: Secondary | ICD-10-CM

## 2018-07-20 DIAGNOSIS — Z3A37 37 weeks gestation of pregnancy: Secondary | ICD-10-CM

## 2018-07-20 LAB — CBC
HCT: 32.9 % — ABNORMAL LOW (ref 36.0–46.0)
Hemoglobin: 11.1 g/dL — ABNORMAL LOW (ref 12.0–15.0)
MCH: 29.3 pg (ref 26.0–34.0)
MCHC: 33.7 g/dL (ref 30.0–36.0)
MCV: 86.8 fL (ref 78.0–100.0)
PLATELETS: 175 10*3/uL (ref 150–400)
RBC: 3.79 MIL/uL — AB (ref 3.87–5.11)
RDW: 13.9 % (ref 11.5–15.5)
WBC: 7.7 10*3/uL (ref 4.0–10.5)

## 2018-07-20 LAB — RPR: RPR: NONREACTIVE

## 2018-07-20 LAB — MRSA PCR SCREENING: MRSA by PCR: NEGATIVE

## 2018-07-20 LAB — RAPID HIV SCREEN (HIV 1/2 AB+AG)
HIV 1/2 ANTIBODIES: NONREACTIVE
HIV-1 P24 Antigen - HIV24: NONREACTIVE

## 2018-07-20 LAB — PREPARE RBC (CROSSMATCH)

## 2018-07-20 SURGERY — Surgical Case
Anesthesia: Spinal | Site: Abdomen | Wound class: Clean Contaminated

## 2018-07-20 SURGERY — Surgical Case
Anesthesia: Regional

## 2018-07-20 MED ORDER — ONDANSETRON HCL 4 MG/2ML IJ SOLN
INTRAMUSCULAR | Status: AC
  Filled 2018-07-20: qty 2

## 2018-07-20 MED ORDER — METHYLERGONOVINE MALEATE 0.2 MG/ML IJ SOLN
INTRAMUSCULAR | Status: AC
  Filled 2018-07-20: qty 1

## 2018-07-20 MED ORDER — OXYTOCIN 10 UNIT/ML IJ SOLN
INTRAMUSCULAR | Status: AC
  Filled 2018-07-20: qty 1

## 2018-07-20 MED ORDER — DEXAMETHASONE SODIUM PHOSPHATE 4 MG/ML IJ SOLN
INTRAMUSCULAR | Status: DC | PRN
  Administered 2018-07-20: 4 mg via INTRAVENOUS

## 2018-07-20 MED ORDER — WITCH HAZEL-GLYCERIN EX PADS: 1.0000 "application " | MEDICATED_PAD | CUTANEOUS | Status: DC | PRN

## 2018-07-20 MED ORDER — MENTHOL 3 MG MT LOZG: 1.0000 | LOZENGE | OROMUCOSAL | Status: DC | PRN

## 2018-07-20 MED ORDER — HYDROMORPHONE HCL 1 MG/ML IJ SOLN
0.2500 mg | INTRAMUSCULAR | Status: DC | PRN
  Administered 2018-07-20: 0.5 mg via INTRAVENOUS
  Administered 2018-07-20: 0.25 mg via INTRAVENOUS

## 2018-07-20 MED ORDER — DEXAMETHASONE SODIUM PHOSPHATE 4 MG/ML IJ SOLN
INTRAMUSCULAR | Status: AC
  Filled 2018-07-20: qty 1

## 2018-07-20 MED ORDER — OXYTOCIN 10 UNIT/ML IJ SOLN
INTRAVENOUS | Status: DC | PRN
  Administered 2018-07-20: 40 [IU] via INTRAVENOUS

## 2018-07-20 MED ORDER — SIMETHICONE 80 MG PO CHEW: 80.0000 mg | CHEWABLE_TABLET | ORAL | Status: DC | PRN

## 2018-07-20 MED ORDER — OXYTOCIN 10 UNIT/ML IJ SOLN
INTRAMUSCULAR | Status: AC
  Filled 2018-07-20: qty 4

## 2018-07-20 MED ORDER — SCOPOLAMINE 1 MG/3DAYS TD PT72
MEDICATED_PATCH | TRANSDERMAL | Status: AC
  Filled 2018-07-20: qty 1

## 2018-07-20 MED ORDER — HYDROMORPHONE HCL 1 MG/ML IJ SOLN
INTRAMUSCULAR | Status: AC
  Filled 2018-07-20: qty 0.5

## 2018-07-20 MED ORDER — PHENYLEPHRINE 40 MCG/ML (10ML) SYRINGE FOR IV PUSH (FOR BLOOD PRESSURE SUPPORT)
PREFILLED_SYRINGE | INTRAVENOUS | Status: AC
  Filled 2018-07-20: qty 10

## 2018-07-20 MED ORDER — HYDROCODONE-ACETAMINOPHEN 7.5-325 MG PO TABS: 1.0000 | ORAL_TABLET | Freq: Once | ORAL | Status: DC | PRN

## 2018-07-20 MED ORDER — COCONUT OIL OIL: 1.0000 "application " | TOPICAL_OIL | Status: DC | PRN

## 2018-07-20 MED ORDER — OXYCODONE HCL 5 MG PO TABS: 10.0000 mg | ORAL_TABLET | ORAL | Status: DC | PRN

## 2018-07-20 MED ORDER — PRENATAL MULTIVITAMIN CH
1.0000 | ORAL_TABLET | Freq: Every day | ORAL | Status: DC
  Administered 2018-07-21 – 2018-07-22 (×2): 1 via ORAL
  Filled 2018-07-20 (×2): qty 1

## 2018-07-20 MED ORDER — METHYLERGONOVINE MALEATE 0.2 MG/ML IJ SOLN
INTRAMUSCULAR | Status: DC | PRN
Start: 2018-07-20 — End: 2018-07-20
  Administered 2018-07-20: 0.2 mg via INTRAMUSCULAR

## 2018-07-20 MED ORDER — PHENYLEPHRINE 8 MG IN D5W 100 ML (0.08MG/ML) PREMIX OPTIME
INJECTION | INTRAVENOUS | Status: DC | PRN
  Administered 2018-07-20: 60 ug/min via INTRAVENOUS

## 2018-07-20 MED ORDER — SIMETHICONE 80 MG PO CHEW
80.0000 mg | CHEWABLE_TABLET | ORAL | Status: DC
  Administered 2018-07-21 (×2): 80 mg via ORAL
  Filled 2018-07-20 (×2): qty 1

## 2018-07-20 MED ORDER — OXYTOCIN 40 UNITS IN LACTATED RINGERS INFUSION - SIMPLE MED: 2.5000 [IU]/h | INTRAVENOUS | Status: AC

## 2018-07-20 MED ORDER — ACETAMINOPHEN 325 MG PO TABS
650.0000 mg | ORAL_TABLET | ORAL | Status: DC | PRN
  Administered 2018-07-20: 650 mg via ORAL
  Filled 2018-07-20: qty 2

## 2018-07-20 MED ORDER — TETANUS-DIPHTH-ACELL PERTUSSIS 5-2.5-18.5 LF-MCG/0.5 IM SUSP: 0.5000 mL | Freq: Once | INTRAMUSCULAR | Status: DC

## 2018-07-20 MED ORDER — ACETAMINOPHEN 10 MG/ML IV SOLN
INTRAVENOUS | Status: AC
  Filled 2018-07-20: qty 100

## 2018-07-20 MED ORDER — BUPIVACAINE IN DEXTROSE 0.75-8.25 % IT SOLN
INTRATHECAL | Status: DC | PRN
  Administered 2018-07-20: 1.6 mL via INTRATHECAL

## 2018-07-20 MED ORDER — MEASLES, MUMPS & RUBELLA VAC ~~LOC~~ INJ: 0.5000 mL | INJECTION | Freq: Once | SUBCUTANEOUS | Status: DC

## 2018-07-20 MED ORDER — LACTATED RINGERS IV SOLN
INTRAVENOUS | Status: DC
  Administered 2018-07-20: 14:00:00 via INTRAVENOUS

## 2018-07-20 MED ORDER — ONDANSETRON HCL 4 MG/2ML IJ SOLN
INTRAMUSCULAR | Status: DC | PRN
  Administered 2018-07-20: 4 mg via INTRAVENOUS

## 2018-07-20 MED ORDER — LACTATED RINGERS IV SOLN
INTRAVENOUS | Status: DC | PRN
  Administered 2018-07-20: 10:00:00 via INTRAVENOUS

## 2018-07-20 MED ORDER — DIBUCAINE 1 % RE OINT: 1.0000 "application " | TOPICAL_OINTMENT | RECTAL | Status: DC | PRN

## 2018-07-20 MED ORDER — SOD CITRATE-CITRIC ACID 500-334 MG/5ML PO SOLN
ORAL | Status: AC
  Administered 2018-07-20: 30 mL
  Filled 2018-07-20: qty 15

## 2018-07-20 MED ORDER — FENTANYL CITRATE (PF) 100 MCG/2ML IJ SOLN
INTRAMUSCULAR | Status: DC | PRN
  Administered 2018-07-20: 50 ug via INTRAVENOUS

## 2018-07-20 MED ORDER — PHENYLEPHRINE 8 MG IN D5W 100 ML (0.08MG/ML) PREMIX OPTIME
INJECTION | INTRAVENOUS | Status: AC
  Filled 2018-07-20: qty 100

## 2018-07-20 MED ORDER — SODIUM CHLORIDE 0.9 % IR SOLN
Status: DC | PRN
  Administered 2018-07-20: 1

## 2018-07-20 MED ORDER — IBUPROFEN 600 MG PO TABS
600.0000 mg | ORAL_TABLET | Freq: Four times a day (QID) | ORAL | Status: DC
  Administered 2018-07-20 – 2018-07-22 (×8): 600 mg via ORAL
  Filled 2018-07-20 (×8): qty 1

## 2018-07-20 MED ORDER — METOCLOPRAMIDE HCL 5 MG/ML IJ SOLN
INTRAMUSCULAR | Status: DC | PRN
  Administered 2018-07-20: 10 mg via INTRAVENOUS

## 2018-07-20 MED ORDER — METOCLOPRAMIDE HCL 5 MG/ML IJ SOLN
INTRAMUSCULAR | Status: AC
  Filled 2018-07-20: qty 2

## 2018-07-20 MED ORDER — DIPHENHYDRAMINE HCL 25 MG PO CAPS
25.0000 mg | ORAL_CAPSULE | Freq: Four times a day (QID) | ORAL | Status: DC | PRN
  Filled 2018-07-20: qty 1

## 2018-07-20 MED ORDER — ACETAMINOPHEN 10 MG/ML IV SOLN
1000.0000 mg | Freq: Once | INTRAVENOUS | Status: DC | PRN
  Administered 2018-07-20: 1000 mg via INTRAVENOUS

## 2018-07-20 MED ORDER — ONDANSETRON HCL 4 MG/2ML IJ SOLN: 4.0000 mg | Freq: Once | INTRAMUSCULAR | Status: DC | PRN

## 2018-07-20 MED ORDER — CEFAZOLIN SODIUM-DEXTROSE 2-4 GM/100ML-% IV SOLN
2.0000 g | INTRAVENOUS | Status: AC
  Administered 2018-07-20: 2 g via INTRAVENOUS
  Filled 2018-07-20 (×2): qty 100

## 2018-07-20 MED ORDER — ZOLPIDEM TARTRATE 5 MG PO TABS: 5.0000 mg | ORAL_TABLET | Freq: Every evening | ORAL | Status: DC | PRN

## 2018-07-20 MED ORDER — SIMETHICONE 80 MG PO CHEW
80.0000 mg | CHEWABLE_TABLET | Freq: Three times a day (TID) | ORAL | Status: DC
  Administered 2018-07-20 – 2018-07-22 (×4): 80 mg via ORAL
  Filled 2018-07-20 (×5): qty 1

## 2018-07-20 MED ORDER — SCOPOLAMINE 1 MG/3DAYS TD PT72
MEDICATED_PATCH | TRANSDERMAL | Status: DC | PRN
  Administered 2018-07-20: 1 via TRANSDERMAL

## 2018-07-20 MED ORDER — FENTANYL CITRATE (PF) 100 MCG/2ML IJ SOLN
INTRAMUSCULAR | Status: AC
  Filled 2018-07-20: qty 2

## 2018-07-20 MED ORDER — ENOXAPARIN SODIUM 40 MG/0.4ML ~~LOC~~ SOLN
40.0000 mg | SUBCUTANEOUS | Status: DC
  Administered 2018-07-21 – 2018-07-22 (×2): 40 mg via SUBCUTANEOUS
  Filled 2018-07-20 (×2): qty 0.4

## 2018-07-20 MED ORDER — LACTATED RINGERS IV SOLN
125.0000 mL/h | INTRAVENOUS | Status: DC
  Administered 2018-07-20: 10:00:00 via INTRAVENOUS
  Administered 2018-07-20: 125 mL/h via INTRAVENOUS

## 2018-07-20 MED ORDER — OXYCODONE HCL 5 MG PO TABS
5.0000 mg | ORAL_TABLET | ORAL | Status: DC | PRN
  Administered 2018-07-21: 5 mg via ORAL
  Filled 2018-07-20: qty 1

## 2018-07-20 MED ORDER — SENNOSIDES-DOCUSATE SODIUM 8.6-50 MG PO TABS
2.0000 | ORAL_TABLET | ORAL | Status: DC
  Administered 2018-07-21 (×2): 2 via ORAL
  Filled 2018-07-20 (×4): qty 2

## 2018-07-20 SURGICAL SUPPLY — 32 items
BENZOIN TINCTURE PRP APPL 2/3 (GAUZE/BANDAGES/DRESSINGS) ×2 IMPLANT
CHLORAPREP W/TINT 26ML (MISCELLANEOUS) ×2 IMPLANT
CLAMP CORD UMBIL (MISCELLANEOUS) IMPLANT
CLOSURE STERI STRIP 1/2 X4 (GAUZE/BANDAGES/DRESSINGS) ×2 IMPLANT
CLOTH BEACON ORANGE TIMEOUT ST (SAFETY) ×2 IMPLANT
DRSG OPSITE POSTOP 4X10 (GAUZE/BANDAGES/DRESSINGS) ×2 IMPLANT
ELECT REM PT RETURN 9FT ADLT (ELECTROSURGICAL) ×2
ELECTRODE REM PT RTRN 9FT ADLT (ELECTROSURGICAL) ×1 IMPLANT
EXTRACTOR VACUUM M CUP 4 TUBE (SUCTIONS) IMPLANT
GLOVE BIOGEL PI IND STRL 7.0 (GLOVE) ×2 IMPLANT
GLOVE BIOGEL PI IND STRL 7.5 (GLOVE) ×2 IMPLANT
GLOVE BIOGEL PI INDICATOR 7.0 (GLOVE) ×2
GLOVE BIOGEL PI INDICATOR 7.5 (GLOVE) ×2
GLOVE ECLIPSE 7.5 STRL STRAW (GLOVE) ×2 IMPLANT
GOWN STRL REUS W/TWL LRG LVL3 (GOWN DISPOSABLE) ×6 IMPLANT
KIT ABG SYR 3ML LUER SLIP (SYRINGE) IMPLANT
NEEDLE HYPO 25X5/8 SAFETYGLIDE (NEEDLE) IMPLANT
NS IRRIG 1000ML POUR BTL (IV SOLUTION) ×2 IMPLANT
PACK C SECTION WH (CUSTOM PROCEDURE TRAY) ×2 IMPLANT
PAD OB MATERNITY 4.3X12.25 (PERSONAL CARE ITEMS) ×2 IMPLANT
PENCIL SMOKE EVAC W/HOLSTER (ELECTROSURGICAL) ×2 IMPLANT
RTRCTR C-SECT PINK 25CM LRG (MISCELLANEOUS) ×2 IMPLANT
STRIP CLOSURE SKIN 1/2X4 (GAUZE/BANDAGES/DRESSINGS) ×2 IMPLANT
SUT PLAIN 2 0 (SUTURE) ×1
SUT PLAIN ABS 2-0 CT1 27XMFL (SUTURE) ×1 IMPLANT
SUT VIC AB 0 CTX 36 (SUTURE) ×3
SUT VIC AB 0 CTX36XBRD ANBCTRL (SUTURE) ×3 IMPLANT
SUT VIC AB 2-0 CT1 27 (SUTURE) ×1
SUT VIC AB 2-0 CT1 TAPERPNT 27 (SUTURE) ×1 IMPLANT
SUT VIC AB 4-0 KS 27 (SUTURE) ×4 IMPLANT
TOWEL OR 17X24 6PK STRL BLUE (TOWEL DISPOSABLE) ×2 IMPLANT
TRAY FOLEY W/BAG SLVR 14FR LF (SET/KITS/TRAYS/PACK) ×2 IMPLANT

## 2018-07-20 NOTE — Anesthesia Procedure Notes (Addendum)
Spinal  Patient location during procedure: OB Start time: 07/20/2018 9:55 AM End time: 07/20/2018 10:03 AM Staffing Anesthesiologist: Trevor IhaHouser, Stephen A, MD Performed: other anesthesia staff  Preanesthetic Checklist Completed: patient identified, surgical consent, pre-op evaluation, timeout performed, IV checked, risks and benefits discussed and monitors and equipment checked Spinal Block Patient position: sitting Prep: site prepped and draped and DuraPrep Patient monitoring: heart rate, cardiac monitor, continuous pulse ox and blood pressure Approach: midline Location: L3-4 Injection technique: single-shot Needle Needle type: Pencan  Needle gauge: 24 G Needle length: 10 cm Assessment Sensory level: T4 Additional Notes 2 Attempts By [employer] CorporationSRNA student, Bonnetta Barryaitlin Clement

## 2018-07-20 NOTE — Transfer of Care (Signed)
Immediate Anesthesia Transfer of Care Note  Patient: Elizabeth Foley  Procedure(s) Performed: CESAREAN SECTION (N/A Abdomen)  Patient Location: PACU  Anesthesia Type:Spinal  Level of Consciousness: awake, alert  and oriented  Airway & Oxygen Therapy: Patient Spontanous Breathing  Post-op Assessment: Report given to RN and Post -op Vital signs reviewed and stable  Post vital signs: Reviewed and stable  Last Vitals:  Vitals Value Taken Time  BP    Temp    Pulse    Resp    SpO2      Last Pain:  Vitals:   07/20/18 0801  TempSrc:   PainSc: 0-No pain      Patients Stated Pain Goal: 1 (07/19/18 0730)  Complications: No apparent anesthesia complications

## 2018-07-20 NOTE — Op Note (Signed)
Elizabeth Foley PROCEDURE DATE: 07/05/2018 - 07/20/2018  PREOPERATIVE DIAGNOSIS: Intrauterine pregnancy at  [redacted]w[redacted]d weeks gestation; anterior placenta previa  POSTOPERATIVE DIAGNOSIS: The same  PROCEDURE: Primary Low Transverse Cesarean Section  SURGEON:  Dr. Candelaria Celeste  ASSISTANT: Dr Marcy Siren  INDICATIONS: Elizabeth Foley is a 32 y.o. G3P3003 at [redacted]w[redacted]d scheduled for cesarean section secondary to anterior placenta previa.  The risks of cesarean section discussed with the patient included but were not limited to: bleeding which may require transfusion or reoperation; infection which may require antibiotics; injury to bowel, bladder, ureters or other surrounding organs; injury to the fetus; need for additional procedures including hysterectomy in the event of a life-threatening hemorrhage; placental abnormalities wth subsequent pregnancies, incisional problems, thromboembolic phenomenon and other postoperative/anesthesia complications. The patient concurred with the proposed plan, giving informed written consent for the procedure.    FINDINGS:  Viable female infant in vertex presentation.  Apgars 7 and 9, weight pending.  Clear amniotic fluid.  Intact placenta, three vessel cord.  Normal uterus, fallopian tubes and ovaries bilaterally.  ANESTHESIA:    Spinal INTRAVENOUS FLUIDS: 3600 ml ESTIMATED BLOOD LOSS: 900 ml URINE OUTPUT:  50 ml SPECIMENS: Placenta sent to L&D COMPLICATIONS: None immediate  PROCEDURE IN DETAIL:  The patient received intravenous antibiotics and had sequential compression devices applied to her lower extremities while in the preoperative area.  She was then taken to the operating room where spinal anesthesia was administered and was found to be adequate. She was then placed in a dorsal supine position with a leftward tilt, and prepped and draped in a sterile manner.  A foley catheter was placed into her bladder and attached to constant gravity, which drained clear fluid  throughout.  After an adequate timeout was performed, a Pfannenstiel skin incision was made with scalpel and carried through to the underlying layer of fascia. The fascia was incised in the midline and this incision was extended bilaterally using the Mayo scissors. Kocher clamps were applied to the superior aspect of the fascial incision and the underlying rectus muscles were dissected off bluntly. A similar process was carried out on the inferior aspect of the facial incision. The rectus muscles were separated in the midline bluntly and the peritoneum was entered bluntly. An Alexis retractor was placed to aid in visualization of the uterus.  Attention was turned to the lower uterine segment where a transverse hysterotomy was made with a scalpel and extended bilaterally bluntly. The placenta was immediately encountered. Blunt dissection was quickly done to find the edge of amniotic sac, which was then ruptured with clear fluid. The infant was successfully delivered, and cord was clamped and cut and infant was handed over to awaiting neonatology team. Uterine massage was then administered and the placenta delivered intact with three-vessel cord. The uterus was then cleared of clot and debris.  The hysterotomy was closed with 0 Vicryl in a running locked fashion, and an imbricating layer was also placed with a 0 Vicryl. A couple of figure eight stitches were placed to improve hemostasis. There was a small 3-4cm hematoma in the left inferior edge of the hysterotomy. This was not expanding and was hemostatic. Overall, excellent hemostasis was noted. The abdomen and the pelvis were cleared of all clot and debris and the Jon Gills was removed. Hemostasis was confirmed on all surfaces.  The peritoneum was reapproximated using 2-0 vicryl running stitches. The fascia was then closed using 0 Vicryl in a running fashion. The subcutaneous layer was reapproximated with plain gut and  the skin was closed with 4-0 vicryl. The patient  tolerated the procedure well. Sponge, lap, instrument and needle counts were correct x 2. She was taken to the recovery room in stable condition.    Levie HeritageStinson, Hideko Esselman J, DO 07/20/2018 11:18 AM

## 2018-07-20 NOTE — Anesthesia Postprocedure Evaluation (Signed)
Anesthesia Post Note  Patient: Arts development officergoc Brandi  Procedure(s) Performed: CESAREAN SECTION (N/A Abdomen)     Patient location during evaluation: PACU Anesthesia Type: Spinal Level of consciousness: oriented and awake and alert Pain management: pain level controlled Vital Signs Assessment: post-procedure vital signs reviewed and stable Respiratory status: spontaneous breathing, respiratory function stable and patient connected to nasal cannula oxygen Cardiovascular status: blood pressure returned to baseline and stable Postop Assessment: no headache, no backache and no apparent nausea or vomiting Anesthetic complications: no    Last Vitals:  Vitals:   07/20/18 1215 07/20/18 1230  BP: 101/66 110/71  Pulse: 85 87  Resp: 19 18  Temp:    SpO2: 99% 97%    Last Pain:  Vitals:   07/20/18 1225  TempSrc:   PainSc: 5    Pain Goal: Patients Stated Pain Goal: 4 (07/20/18 1225)               Trevor IhaStephen A Houser

## 2018-07-20 NOTE — Progress Notes (Signed)
FOB understands good english. Live interpreter in to do all Admission teaching. Formula guidelines given and explained. Latching encouraged before formula. Hand expression taught. Color changes taught and bulb suction taught. Feeding sheet explained. Incentive spirometer  taught and explained. Mother encouraged to skin to skin . Crib supplies all explained via interpreter. Pain meds explained. Patient told call out for pain meds when ready. Call bell and emergency bell explained. Patient told about ordering food. Patient wants to bottle and breast and pump.

## 2018-07-21 LAB — CREATININE, SERUM
CREATININE: 0.39 mg/dL — AB (ref 0.44–1.00)
GFR calc Af Amer: >60 mL/min (ref >60)
GFR calc non Af Amer: >60 mL/min (ref >60)

## 2018-07-21 LAB — CBC
HCT: 30.1 % — ABNORMAL LOW (ref 36.0–46.0)
Hemoglobin: 10.2 g/dL — ABNORMAL LOW (ref 12.0–15.0)
MCH: 29.6 pg (ref 26.0–34.0)
MCHC: 33.9 g/dL (ref 30.0–36.0)
MCV: 87.2 fL (ref 78.0–100.0)
PLATELETS: 177 10*3/uL (ref 150–400)
RBC: 3.45 MIL/uL — ABNORMAL LOW (ref 3.87–5.11)
RDW: 13.9 % (ref 11.5–15.5)
WBC: 9.1 10*3/uL (ref 4.0–10.5)

## 2018-07-21 NOTE — Progress Notes (Signed)
Subjective: Postpartum Day 1: Cesarean Delivery Patient reports incisional pain and tolerating PO.    Objective: Vital signs in last 24 hours: Temp:  [97.7 F (36.5 C)-98.5 F (36.9 C)] 98.1 F (36.7 C) (08/07 0500) Pulse Rate:  [70-102] 80 (08/07 0500) Resp:  [14-23] 18 (08/07 0500) BP: (96-115)/(65-78) 102/68 (08/07 0500) SpO2:  [95 %-100 %] 100 % (08/07 0500)  Physical Exam:  General: alert, cooperative and no distress Lochia: appropriate Uterine Fundus: firm Incision: healing well, no significant drainage DVT Evaluation: No evidence of DVT seen on physical exam.  Recent Labs    07/20/18 0545 07/21/18 0520  HGB 11.1* 10.2*  HCT 32.9* 30.1*    Assessment/Plan: Status post Cesarean section. Doing well postoperatively.  Continue current care.  Wynelle BourgeoisMarie Vietta Bonifield 07/21/2018, 8:09 AM

## 2018-07-21 NOTE — Anesthesia Postprocedure Evaluation (Signed)
Anesthesia Post Note  Patient: [name]  Procedure(s) Performed: CESAREAN SECTION (N/A Abdomen)     Patient location during evaluation: Mother Baby Anesthesia Type: Spinal Level of consciousness: awake and alert Pain management: pain level controlled Vital Signs Assessment: post-procedure vital signs reviewed and stable Respiratory status: spontaneous breathing, nonlabored ventilation and respiratory function stable Cardiovascular status: stable Postop Assessment: no headache, no backache, spinal receding, no apparent nausea or vomiting, able to ambulate, patient able to bend at knees and adequate PO intake Anesthetic complications: no    Last Vitals:  Vitals:   07/21/18 0015 07/21/18 0500  BP: 101/78 102/68  Pulse: 86 80  Resp: 18 18  Temp: 36.7 C 36.7 C  SpO2: 99% 100%    Last Pain:  Vitals:   07/21/18 0710  TempSrc:   PainSc: Asleep   Pain Goal: Patients Stated Pain Goal: 4 (07/20/18 1225)               Laban EmperorMalinova,Harel Repetto Hristova

## 2018-07-21 NOTE — Addendum Note (Signed)
Addendum  created 07/21/18 0826 by Elgie CongoMalinova, Brendaliz Kuk H, CRNA   Sign clinical note

## 2018-07-21 NOTE — Lactation Note (Signed)
This note was copied from a baby's chart. Lactation Consultation Note  Patient Name: Elizabeth Holley Bouchegoc Leet OZHYQ'MToday's Date: 07/21/2018 Reason for consult: Initial assessment;Early term 37-38.6wks;Infant < 6lbs  P3 mother whose infant is now 6027 hours old. This is a 37+0 week baby who weighs 5+9.2 oz.   Mother speaks [place].  Mother requested for FOB to be her interpreter.  I asked FOB if he was willing and he agreed.  Mother has no questions/concerns about breastfeeding at this time.  She stated she feels like baby has latched well and her goal is to breast/bottle feed.  She is supplementing with Sim 22 since baby is <6 lbs. Mother is using a slow flow nipple.  She has not been pumping regularly because, according to her, "I am not getting anything when I pump."  I explained the importance of pumping regularly and that she may not obtain milk for a couple of pumpings.  However, with continued frequency of pumping and hand expression mother will eventually obtain colostrum.  Parents verbalized understanding.  Mother will feed back any EBM she may obtain with pumping and hand expression and feed this back to baby before using formula.    Offered to watch a latch the next time baby has to feed and mother will call as needed for assistance.  Reminded to feed 8-12 times/24 hours or sooner if she shows feeding cues.  Parents have no questions at this time.  Baby sleeping.  Family in room.       Maternal Data Formula Feeding for Exclusion: No Has patient been taught Hand Expression?: Yes  Feeding Feeding Type: Bottle Fed - Formula Length of feed: 10 min  LATCH Score                   Interventions    Lactation Tools Discussed/Used Pump Review: Setup, frequency, and cleaning;Milk Storage Initiated by:: Set up already; mother has no further questions; encouraged to pump regularly   Consult Status Consult Status: Follow-up Date: 07/22/18 Follow-up type: In-patient    [name] R  Daylen Lipsky 07/21/2018, 1:45 PM

## 2018-07-22 MED ORDER — OXYCODONE HCL 5 MG PO TABS: 5.0000 mg | ORAL_TABLET | ORAL | 0 refills | Status: AC | PRN

## 2018-07-22 MED ORDER — IBUPROFEN 600 MG PO TABS: 600.0000 mg | ORAL_TABLET | Freq: Four times a day (QID) | ORAL | 0 refills | Status: AC | PRN

## 2018-07-22 NOTE — Discharge Summary (Signed)
OB Discharge Summary     Patient Name: Elizabeth Foley DOB: [birth year] MRN: [id number]  Date of admission: 07/05/2018 Delivering MD: Levie HeritageSTINSON, JACOB J   Date of discharge: 07/22/2018  Admitting diagnosis: 34WKS, BLEEDING Intrauterine pregnancy: [id number]     Secondary diagnosis:  Active Problems:   Supervision of high risk pregnancy, antepartum, third trimester   TB lung, latent   Placenta previa   Vaginal bleeding during pregnancy  Additional problems: none     Discharge diagnosis: Term Pregnancy Delivered                                                                                                Post partum procedures:none  Augmentation: N/A  Complications: None  Hospital course:  Sceduled C/S   32 y.o. yo [id number] at 1375w0d was admitted to the hospital 07/05/2018 for scheduled cesarean section with the following indication:Previa- anterior. Membrane Rupture Time/Date: 10:25 AM ,07/20/2018   Patient delivered a Viable infant.07/20/2018  Details of operation can be found in separate operative note.  Pateint had an uncomplicated postpartum course.  She is ambulating, tolerating a regular diet, passing flatus, and urinating well. Patient is discharged home in stable condition on  07/22/18- per her request for early d/c.         Physical exam  Vitals:   07/21/18 0900 07/21/18 1400 07/21/18 2353 07/22/18 0549  BP: (!) 89/60 (!) 84/54 100/69 91/61  Pulse: 77 87 96 83  Resp: 18 20 19 20   Temp: (!) 97.5 F (36.4 C) 98.1 F (36.7 C) 97.9 F (36.6 C)   TempSrc: Oral Oral Oral Oral  SpO2:  99% 96% 100%  Weight:      Height:       General: alert and cooperative Lochia: appropriate Uterine Fundus: firm Incision: honeycomb intact, dry DVT Evaluation: No evidence of DVT seen on physical exam. Labs: Lab Results  Component Value Date   WBC 9.1 07/21/2018   HGB 10.2 (L) 07/21/2018   HCT 30.1 (L) 07/21/2018   MCV 87.2 07/21/2018   PLT 177 07/21/2018   CMP Latest Ref Rng & Units  07/21/2018  Glucose 65 - 99 mg/dL -  Creatinine 4.090.44 - 8.111.00 mg/dL 9.14(N0.39(L)    Discharge instruction: per After Visit Summary and "Baby and Me Booklet".  After visit meds:  Allergies as of 07/22/2018   No Known Allergies     Medication List    TAKE these medications   ibuprofen 600 MG tablet Commonly known as:  ADVIL,MOTRIN Take 1 tablet (600 mg total) by mouth every 6 (six) hours as needed.   oxyCODONE 5 MG immediate release tablet Commonly known as:  Oxy IR/ROXICODONE Take 1 tablet (5 mg total) by mouth every 4 (four) hours as needed (pain scale 4-7).   PRENATAL VITAMIN PO Take 1 tablet by mouth daily.       Diet: routine diet  Activity: Advance as tolerated. Pelvic rest for 6 weeks.   Outpatient follow WG:NFAOZHYQup:incision check in 1 wk; PP visit in 4 weeks Follow up Appt:No future appointments. Follow up Visit:No follow-ups on file.  Postpartum contraception:  None  Newborn Data: Live born female  Birth Weight: 5 lb 10.7 oz (2570 g) APGAR: 7, 9  Newborn Delivery   Birth date/time:  07/20/2018 10:26:00 Delivery type:  C-Section, Low Transverse Trial of labor:  No C-section categorization:  Primary     Baby Feeding: Bottle and Breast Disposition:home with mother   07/22/2018 Cam Hai, CNM  8:59 AM

## 2018-07-22 NOTE — Discharge Instructions (Signed)
Sinh m?, Ch?m [place] sau m? Cesarean Delivery, Care After Hy tham kh?o t? thng tin ny trong vi tu?n t?i. Nh?ng h??ng d?n ny cung c?p cho qu v? thng tin v? cch ch?m [place] b?n thn sau khi lm th? thu?t. Chuyn gia ch?m [place] s?c kh?e c?ng c th? c h??ng d?n c? th? h?n cho qu v?. Vi?c ?i?u tr? c?a qu v? ? ???c ln k? ho?ch theo th?c hnh y khoa hi?n t?i, nh?ng v?n ?? ?i khi v?n x?y ra. Hy g?i cho chuyn gia ch?m [place] s?c kh?e n?u qu v? c b?t k? v?n ?? ho?c th?c m?c no sau khi lm th? thu?t. Ti c th? d? ki?n ?i?u g s? x?y ra sau khi lm th? thu?t? Sau khi lm th? thu?t, qu v? th??ng b?:  M?t l???ng ma?u nho? ho??c ch?t di?ch trong cha?y ra ? v?t m?.  M?t cht t?y ??, s?ng n? v ?au t?i khu v?c v?t m?.  ?au m?t cht ? b?ng v ?au nh?c.  Ch?y mu m ??o (s?n d?ch).  Co th??t vu?ng ch?u.  M?t m?i.  Tun th? nh?ng h??ng d?n ny ? nh: Ch?m [place] v?t m?   Tun th? ch? d?n c?a chuyn gia ch?m [place] s?c kh?e v? cch ch?m [place] v?t m?. ??m b?o qu v?: ? R?a tay b?ng x phng v n??c tr??c khi qu v? thay b?ng (b?ng). N?u khng c x phng v n??c, hy dng thu?c st trng tay. ? Thay b?ng theo ch? d?n c?a chuyn gia ch?m [place] s?c kh?e. ? ?? cc m?i khu (ch? ph?u thu?t), ghim khu ph?u thu?t, keo dn da, ho?c cc d?i b?ng dnh t?i ch?. Nh?ng th? dng ?? ?ng v?t m? trn da ny c th? c?n ?? nguyn t?i ch? trong 2 tu?n ho?c lu h?n. N?u cc mp d?i b?ng dnh b?t ??u long ra v cong ln, qu v? c th? c?t nh?ng ch? mp b? long ra ?Imagene Sheller. Khng bc h?t d?i b?ng dnh ra tr? khi chuyn gia ch?m [place] s?c kh?e b?o qu v? lm nh? v?y.  Ki?m tra v?t m? m?i ngy xem c d?u hi?u nhi?m trng hay khng. Ki?m tra xem c: ? T?y ??, s?ng n?, ho?c ?au nhi?u h?n. ? Nhi?u d?ch ho?c mu h?n. ? ?m. ? M? ho?c mi hi.  Khi qu v? ho ho?c h?t h?i, hy m l?y g?i ng?. Vi?c ny gip gi?m ?au v gi?m kh? n?ng v?t m? c?a qu v? b? h mi?ng (n?t ra). Lm nh? v?y cho ??n khi v?t m? c?a qu v? lnh l?i. Thu?c  Ch?  s? d?ng thu?c khng k ??n v thu?c k ??n theo ch? d?n c?a chuyn gia ch?m [place] s?c kh?e.  N?u qu v? ???c k thu?c khng sinh, hy dng thu?c theo ch? d?n c?a chuyn gia ch?m  s?c kh?e. Khng d??ng du?ng thu?c kha?ng sinh cho ??n khi h?t thu?c. Li xe  Khng li xe ho?c v?n hnh my mc h?ng n?ng trong khi dng thu?c gi?m ?au ???c k ??n.  Khng li xe trong vng 24 gi? n?u qu v? ? dng thu?c an th?n. L?i s?ng  Khng u?ng r??u. ?i?u na?y ??c bi?t quan tro?ng n?u quy? vi? ?ang nui con b??ng s??a me? ho??c ?ang du?ng thu?c gia?m ?au.  Khng s? d?ng cc s?n ph?m thu?c l no, bao g?m thu?c l d?ng ht, thu?c l d?ng nhai ho?c thu?c l ?i?n t?. N?u qu v? c?n gip ?? ?? cai thu?c, hy  h?i chuyn gia ch?m [place] s?c kh?e. Thu?c l c th? lm ch?m qu trnh lnh v?t th??ng. ?n v u?ng  U?ng i?t nh?t 8 ly m?i ly ta?m au-x? n???c m?i nga?y tr?? khi chuyn gia ch?m so?c s??c kho?e ba?o quy? vi? khng la?m v?y. N?u quy? vi? nui con b??ng s??a me?, quy? vi? co? th? c?n u?ng nhi?u n???c h?n m??c na?y.  ?n ca?c th??c ph?m nhi?u ch?t x? m?i nga?y. Nh??ng th??c ph?m na?y co? th? gip ng?n ng?a ho??c gia?m ta?o bo?n. Th??c ph?m nhi?u ch?t x? bao g?m: ? Ngu? c?c va? ba?nh mi? nguyn h?t. ? Ga?o l??t. ? ??u. ? Tri cy v rau c? t??i. Ho?t ??ng  Tr? l?i sinh ho?t bnh th??ng theo ch? d?n c?a chuyn gia ch?m [place] s?c kh?e. H?i chuyn gia ch?m [place] s?c kh?e v? cc ho?t ??ng no an ton cho qu v?.  Ngh? ng?i cng nhi?u cng t?t. C? g??ng nghi? ng?i ho?c ngu? ch??p m??t khi con quy? vi? ngu?Imagene Sheller nng b?t ky? v?t gi? n??ng h?n con quy? vi? ho??c qu 10 lb (4,5 kg) theo ch? d?n c?a chuyn gia ch?m so?c s??c kho?e.  H?i chuyn gia ch?m so?c s??c kho?e cu?a quy? vi? v? vi?c khi na?o quy? vi? co? th? quan h? ti?nh du?c. ?i?u na?y c th? phu? thu?c va?o: ? Nguy c? nhi?m tru?ng. ? T? l? lnh l?i. ? C?m gic thoa?i ma?i va? mong mu?n quan h? ti?nh  du?c. T??m  Khng t?m b?n, b?i, ho?c s? d?ng b?n t?m n??c nng cho ??n khi ???c chuyn gia ch?m [place] s?c kh?e c?a qu v? ch?p thu?n. Hy h?i chuyn gia ch?m [place] s?c kh?e xem qu v? c th? t?m vi hoa sen khng. Qu v? c th? ch? ???c php dng b?t bi?n ?? lau ng??i cho ??n khi v?t m? c?a qu v? lnh l?i.  Gi? cho b?ng kh theo ch? d?n c?a chuyn gia ch?m [place] s?c kh?e. H??ng d?n chung  Khng s?? du?ng nu?t g?c ho??c thu?t r??a cho ??n khi ???c chuyn gia ch?m so?c s??c kho?e ch?p thu?n.  M?c: ? Qu?n a?o r?ng, thoa?i ma?i. ? N?t ng?c v?a v?n v nng ?? t?t.  Nhi?n xem co? b?t ky? cu?c ma?u na?o ma? co? th? tri ra t?? m ?a?o khng. Nh??ng cu?c ma?u na?y co? th? trng nh? nh??ng cu?c khi? h? ?o? th?m, nu ho??c ?en.  Gi? cho vu?ng ?a?y ch?u s?ch v kh theo ch? d?n c?a chuyn gia ch?m [place] s?c kh?e.  Lau t?? tr???c ra sau khi quy? vi? ?i v? sinh.  N?u co? th?, ha?y nh?? ai ? giu?p quy? vi? vi?c nha? va? ch?m so?c con quy? vi? va?i nga?y sau khi quy? vi? ra vi?n.  Tun th? t?t c? cc l?n khm theo di cho quy? vi? va? con quy? vi? theo ch? d?n c?a chuyn gia ch?m [place] s?c kh?e. ?i?u ny c vai tr quan tr?ng. Hy lin l?c v?i chuyn gia ch?m  s?c kh?e n?u:  Qu v? bi?: ? Ti?t d?ch c mi kh ch?u ? m ??o. ? ?i ti?u kh kh?n. ? ?au khi ?i ti?u. ? ??t ng?t t?ng ho??c gia?m s? l?n ?a?i ti?n. ? Qu v? b? t?y ??, s?ng n?, ho?c ?au nhi?u h?n xung quanh v?t m?. ? Qu v? c nhi?u ch?t d?ch ho?c mu h?n ch?y ra t? v?t m?. ? M? ho?c mi hi thot ra t? v?t m?. ? S?t. ? Pht ban. ? I?t [place] Ned  tm ho??c [employer] tm ??n ca?c hoa?t ??ng ma? quy? vi? t??ng thi?ch thu?. ? Ca?c cu ho?i v? ch?m so?c ba?n thn ho?c con quy? vi?. ? Bu?n nn.  C?m th?y ?m khi s? vo v?t m? c?a qu v?.  Vu? cu?a quy? vi? chuy?n sang mu ?? ho?c b? ?au ho?c c?ng.  Quy? vi? ca?m th?y bu?n ho??c lo l??ng b?t th???ng.  Qu v? nn.  Qu v? c cc c?c mu ?ng l?n tri ra t? m ??o. N?u  quy? vi? co? m?t cu?c ma?u ?ng tri ra t? m ??o, ha?y gi?? la?i ?? cho chuyn gia ch?m so?c s??c kho?e cu?a quy? vi? xem. Khng xa? tri ca?c cu?c ma?u ?ng xu?ng b?n c?u ma? ch?a cho chuyn gia ch?m so?c s??c kho?e xem.  Qu v? ?i ti?u nhi?u h?n bnh th??ng.  Quy? vi? b? chng m?t ho?c choa?ng va?ng.  Quy? vi? ch?a cho con bu? b?ng s?a m? va? quy? vi? ch?a th?y kinh nguy?t trong 12 tu?n sau khi sinh con.  Quy? vi? d??ng cho con bu? b?ng s?a m? va? quy? vi? ch?a th?y kinh nguy?t trong 12 tu?n sau khi ng??ng cho con bu?Cathie [place]ops c?u tr? gip ngay l?p t?c n?u:  Qu v? bi?: ? ?au ma? khng h?t ho?c khng ??? sau khi du?ng thu?c. ? ?au ng?c. ? Kh th?. ? Nhi?n m?? ho?c nhi?n th?y ??m. ? Y? nghi? v? vi?c la?m t?n th??ng cho ba?n thn ho?c con quy? vi?. ? Qu v? m?i b? ?au ? b?ng ho?c ? m?t trong hai chn. ? ?au ??u r?t nhi?u.  Qu v? b? ng?t.  Quy? vi? bi? cha?y ma?u ?? m ?a?o nhi?u ??n m??c quy? vi? th?m ???t hai b?ng v? sinh trong vo?ng m?t gi??. Thng tin ny khng nh?m m?c ?ch thay th? cho l?i khuyn m chuyn gia ch?m [place] s?c kh?e ni v?i qu v?. Hy b?o ??m qu v? ph?i th?o lu?n b?t k? v?n ?? g m qu v? c v?i chuyn gia ch?m [place] s?c kh?e c?a qu v?. Document Released: 03/24/2016 Document Revised: 03/16/2017 Document Reviewed: 11/05/2015 Elsevier Interactive Patient Education  2018 [employer].

## 2018-07-23 LAB — TYPE AND SCREEN
ABO/RH(D): O POS
Antibody Screen: NEGATIVE
UNIT DIVISION: 0
Unit division: 0

## 2018-07-23 LAB — BPAM RBC
Blood Product Expiration Date: 201909082359
Blood Product Expiration Date: 201909082359
UNIT TYPE AND RH: 5100
Unit Type and Rh: 5100

## 2018-08-24 ENCOUNTER — Ambulatory Visit: Payer: BLUE CROSS/BLUE SHIELD | Admitting: Student

## 2018-08-30 ENCOUNTER — Ambulatory Visit (INDEPENDENT_AMBULATORY_CARE_PROVIDER_SITE_OTHER): Payer: BLUE CROSS/BLUE SHIELD | Admitting: Advanced Practice Midwife

## 2018-08-30 DIAGNOSIS — Z3009 Encounter for other general counseling and advice on contraception: Secondary | ICD-10-CM

## 2018-08-30 DIAGNOSIS — Z30430 Encounter for insertion of intrauterine contraceptive device: Secondary | ICD-10-CM

## 2018-08-30 DIAGNOSIS — Z1389 Encounter for screening for other disorder: Secondary | ICD-10-CM | POA: Diagnosis not present

## 2018-08-30 LAB — POCT PREGNANCY, URINE: Preg Test, Ur: NEGATIVE

## 2018-08-30 MED ORDER — LEVONORGESTREL 19.5 MCG/DAY IU IUD
INTRAUTERINE_SYSTEM | Freq: Once | INTRAUTERINE | Status: AC
Start: 2018-08-30 — End: 2018-08-30
  Administered 2018-08-30: 1 via INTRAUTERINE

## 2018-08-30 NOTE — Progress Notes (Signed)
Subjective:     Elizabeth Foley is a 32 y.o. female who presents for a postpartum visit. She is 6 weeks postpartum following a low cervical transverse Cesarean section. I have fully reviewed the prenatal and intrapartum course. The delivery was at 37 gestational weeks. Outcome: primary cesarean section, low transverse incision. Anesthesia: spinal. Postpartum course has been unremarkable. Baby's course has been unremarkable. Baby is feeding by both breast and bottle - Similac with Iron. Bleeding no bleeding. Bowel function is normal. Bladder function is normal. Patient is not sexually active. Contraception method is IUD. Postpartum depression screening: negative.  The following portions of the patient's history were reviewed and updated as appropriate: allergies, current medications, past family history, past medical history, past social history, past surgical history and problem list.  Review of Systems Pertinent items are noted in HPI.   Objective:    There were no vitals taken for this visit.  General:  alert and cooperative   Breasts:  negative  Lungs: clear to auscultation bilaterally  Heart:  regular rate and rhythm, S1, S2 normal, no murmur, click, rub or gallop  Abdomen: soft, non-tender; bowel sounds normal; no masses,  no organomegaly   Vulva:  normal  Vagina: normal vagina  Cervix:  multiparous appearance  Corpus: normal size, contour, position, consistency, mobility, non-tender  Adnexa:  normal adnexa  Rectal Exam: Not performed.         Reviewed with the patient all forms of birth control options available including abstinence; over the counter/barrier methods; hormonal contraceptive medication including pill, patch, ring, Depo-Provera injection, Nexplanon; Mirena/Liletta and Paragard IUDs; permanent sterilization options including vasectomy, tubal ligation (laparoscopic and hysteroscopic/Essure). Risks and benefits reviewed.  Questions were answered.  Information was given to patient  to review.   Patient has elected for an IUD   GYNECOLOGY OFFICE PROCEDURE NOTE  Elizabeth Foley is a 32 y.o. [id number] here for Liletta IUD insertion. No GYN concerns.  Last pap smear was on 02/15/2018 and was normal.  IUD Insertion Procedure Note Patient identified, informed consent performed, consent signed.   Discussed risks of irregular bleeding, cramping, infection, malpositioning or misplacement of the IUD outside the uterus which may require further procedure such as laparoscopy. Time out was performed.  Urine pregnancy test negative.  Speculum placed in the vagina.  Cervix visualized.  Cleaned with Betadine x 2.  Grasped anteriorly with a single tooth tenaculum.  Uterus sounded to 7 cm.  Liletta IUD placed per manufacturer's recommendations.  Strings trimmed to 3 cm. Tenaculum was removed, good hemostasis noted.  Patient tolerated procedure well.   Patient was given post-procedure instructions.  She was advised to have backup contraception for one week.  Patient was also asked to check IUD strings periodically and follow up in 4 weeks for IUD check.   Assessment:     Normal postpartum exam. Pap smear not done at today's visit.   Plan:    1. Contraception: IUD 2. Routine care 3. Follow up in: 4 weeks or as needed.

## 2018-08-30 NOTE — Patient Instructions (Signed)
Levonorgestrel intrauterine device (IUD) ?y l thu?c g? LEVONORGESTREL D?ng C? ??t Trong T? Cung l d?ng c? trnh thai (ng?a thai). D?ng c? ny ???c ??t vo bn trong t? cung b?i m?t chuyn vin y t?. N ???c dng ?? ng?a thai. D?ng c? ny c?ng c th? ???c dng ?? ?i?u tr? tnh tr?ng b? ra huy?t nhi?u trong k? kinh. Thu?c ny c th? ???c dng cho nh?ng m?c ?ch khc; hy h?i ng??i cung c?p d?ch v? y t? ho?c d??c s? c?a mnh, n?u qu v? c th?c m?c. (CC) NHN HI?U PH? BI?N: Kyleena, LILETTA, Mirena, Skyla Ti c?n ph?i bo cho ng??i cung c?p d?ch v? y t? c?a mnh ?i?u g tr??c khi dng thu?c ny? H? c?n bi?t li?u qu v? c b?t k? tnh tr?ng no sau ?y khng: -phi?n ?? m ??o (Pap smear) b?t th??ng -ung th? v, t? cung, ho?c c? t? cung -b?nh ti?u ???ng -vim n?i m?c t? cung -vim c? quan sinh d?c ho?c vim vng khung ch?u hi?n t?i ho?c trong qu kh? -c nhi?u h?n m?t b?n tnh ho?c b?n tnh c?a qu v? c nhi?u h?n m?t b?n tnh -b?nh tim -ti?n s? b? mang thai l?c ch? ho?c mang thai trong vi tr?ng -cc v?n ?? v? h? mi?n d?ch -?ang dng d?ng c? ??t t? cung (IUD) -b??u ho?c b?nh gan -cc v?n ?? v? c?c mu ?ng ho?c dng thu?c lm long mu -co gi?t (kinh phong) -dng cc thu?c qua ???ng t?nh m?ch -t? cung hnh d?ng b?t th??ng -ra huy?t m ??o khng r nguyn nhn -ph?n ?ng b?t th??ng ho?c d? ?ng v?i levonorgestrel, cc n?i ti?t t? khc, silicone, ho?c polyethylene -pha?n ??ng b?t th???ng ho??c di? ??ng v??i ca?c d??c ph?m kha?c, th?c ph?m, thu?c nhu?m, ho??c ch?t ba?o qua?n -?ang c thai ho??c ??nh co? thai -?ang cho con bu? Ti nn s? d?ng thu?c ny nh? th? no? D?ng c? ny ???c ??t vo bn trong t? cung b?i m?t chuyn vin y t?. Hy bn v?i bc s? nhi khoa c?a qu v? v? vi?c dng thu?c ny ? tr? em. C th? c?n ch?m sc ??c bi?t. Qu li?u: N?u qu v? cho r?ng mnh ? dng qu nhi?u thu?c ny, th hy lin l?c v?i trung tm ki?m sot ch?t ??c ho?c phng c?p c?u ngay l?p t?c. L?U :  Thu?c ny ch? dnh ring cho qu v?. Khng chia s? thu?c ny v?i nh?ng ng??i khc. N?u ti l? qun m?t li?u th sao? ?i?u ny khng p d?ng. Ty thu?c vo th??ng hi?u c?a d?ng c? m qu v? ? ??t, d?ng c? ny s? c?n ph?i ???c thay th? m?i 3 ??n 5 n?m m?t l?n, n?u qu v? mu?n ti?p t?c s? d?ng bi?n php ng?a thai ny. Nh?ng g c th? t??ng tc v?i thu?c ny? Khng ???c dng thu?c ny cng v?i b?t k? th? no sau ?y: -amprenavir -bosentan -fosamprenavir Thu?c ny c?ng c th? t??ng tc v?i cc thu?c sau ?y: -aprepitant -armodafinil -cc thu?c nhm barbiturate dng ?? gy ng? ho?c ?? ?i?u tr? cc ch?ng co gi?t -bexarotene -boceprevir -griseofulvin -m?t s? thu?c dng cho cc ch?ng co gi?t, ch?ng h?n nh? carbamazepine, ethotoin, felbamate, oxcarbazepine, phenytoin, topiramate -modafinil -pioglitazone -rifabutin -rifampicin -rifapentine -m?t s? thu?c dng ?? ?i?u tr? nhi?m HIV, nh? l atazanavir, efavirenz, indinavir, lopinavir, nelfinavir, tipranavir, ritonavir -cy [facility] (c? [facility]/cy n?c s?i/cy l?nh) -warfarin Danh sch ny c th? khng m t? ?? h?t cc t??ng tc c   th? x?y ra. Hy ??a cho ng??i cung c?p d?ch v? y t? c?a mnh danh sch t?t c? cc thu?c, th?o d??c, cc thu?c khng c?n toa, ho?c cc ch? ph?m b? sung m qu v? dng. C?ng nn bo cho h? bi?t r?ng qu v? c ht thu?c, u?ng r??u, ho?c c s? d?ng ma ty tri php hay khng. Vi th? c th? t??ng tc v?i thu?c c?a qu v?. Ti c?n ph?i theo di ?i?u g trong khi dng thu?c ny? Hy ??n g?p bc s? ho?c chuyn vin y t? ?? theo di ??nh k? s?c kh?e c?a mnh. Hy ?i bc s?, n?u qu v? ho?c b?n tnh c?a qu v? c sinh ho?t tnh d?c v?i nh?ng ng??i khc, b? d??ng tnh v?i HIV, ho?c b? m?c b?nh ly qua ???ng tnh d?c. Thu?c ny khng b?o v? cho qu v? kh?i b? nhi?m HIV ho?c AIDS ho?c cc b?nh ly qua ???ng tnh d?c khc. Qu v? c th? t? mnh ki?m tra v? tr c?a d?ng c? ??t trong t? ung (IUD) b?ng cch dng ngn tay lu?n ln  t?i ??nh c?a m ??o ?? s? th?y s?i ch?. Khng ???c ko s?i ch?. Ki?m tra v? tr c?a d?ng c? sau m?i k? kinh nguy?t l m?t thi quen t?t. Hy lin l?c v?i bc s? ho?c chuyn vin y t? ngay l?p t?c, n?u qu v? s? th?y ph?n thn c?a d?ng c? ??t trong t? cung (IUD) ch? khng ch? ring s?i ch?, ho?c n?u qu v? hon ton khng th? s? th?y s?i ch?. D?ng c? ??t trong t? cung (IUD) c th? t? r?t ra. Qu v? c th? th? thai, n?u d?ng c? r?t ra. N?u qu v? ??  th?y d?ng c? ??t trong t? cung (IUD - Intrauterine device) ? st ra, th hy dng m?t bi?n php ng?a thai d? phng, ch?ng h?n nh? bao cao su v lin l?c v?i bc s? ho?c chuyn vin y t? c?a mnh. Dng b?ng v? sinh d?ng th?i (tampon) s? khng lm thay ??i v? tr c?a d?ng c? ??t trong t? cung (IUD) v b?ng v? sinh d?ng th?i (tampon) c th? dng ???c trong khi c kinh nguy?t. D?ng c? ??t trong t? cung (IUD - intrauterine device) ny ch? c th? an ton khi ch?p c?ng h??ng t? (MRI) d??i nh?ng ?i?u ki?n c? th?. Tr??c khi ch?p MRI, hay bo cho bc s? ho?c chuyn vin y t? bi?t r?ng qu v? ?ang dng d?ng c? ??t trong t? cung (IUD), v lo?i d?ng c? ??t trong t? cung (IUD) m qu v? ?ang dng. Ti c th? nh?n th?y nh?ng tc d?ng ph? no khi dng thu?c ny? Nh?ng tc d?ng ph? qu v? c?n ph?i bo cho bc s? ho?c chuyn vin y t? cng s?m cng t?t: -cc ph?n ?ng d? ?ng, ch?ng h?n nh? da b? m?n ??, ng?a, n?i my ?ay, s?ng ? m?t, mi, ho?c l??i -s?t ho?c cc tri?u ch?ng gi?ng cm -lot c? quan sinh d?c -huy?t a?p cao -khng c kinh nguy?t trong 6 tu?n trong khi s? d?ng -?au, s?ng ho?c c?m th?y nng ? chn -?au ho?c  ?m vng khung ch?u -?au ??u d? d?i ho?c ??t ng?t -cc d?u hi?u c thai -?au qu?n b?ng -kh th? ??t ng?t -cc v?n ?? v? gi? th?ng b?ng, ni n?ng, ?i l?i -d?ch ti?t, xu?t huy?t m ??o b?t th??ng -vng da ho?c m?t Cc tc d?ng ph? khng c?n ph?i ch?m sc y t? (hy bo cho   bc s? ho?c chuyn vin y t?, n?u cc tc d?ng ph? ny ti?p di?n ho?c gy phi?n  toi): -m?n tr?ng c -?au v -cc thay ??i v? ham mu?n tnh d?c ho?c kh? n?ng tnh d?c -t?ng cn ho?c s?t cn -?au qu?n, chng m?t ho?c b? ng?t x?u khi d?ng c? ?ang ???c ??t vo -?au ??u -hnh kinh khng ??u n?i trong 3 ??n 6 thng ??u s? d?ng -bu?n i [facility] sch ny c th? khng m t? ?? h?t cc tc d?ng ph? c th? x?y ra. Xin g?i t?i bc s? c?a mnh ?? ???c c? v?n chuyn mn v? cc tc d?ng ph?. Qu v? c th? t??ng trnh cc tc d?ng ph? cho FDA theo s? [phone]. Ti nn c?t gi? thu?c c?a mnh ? ?u? ?i?u ny khng p d?ng. L?U : ?y l b?n tm t?t. N c th? khng bao hm t?t c? thng tin c th? c. N?u qu v? th?c m?c v? thu?c ny, xin trao ??i v?i bc s?, d??c s?, ho?c ng??i cung c?p d?ch v? y t? c?a mnh.  2018 [employer]/Gold Standard (2017-01-01 00:00:00)  

## 2018-09-01 ENCOUNTER — Encounter: Payer: Self-pay | Admitting: *Deleted

## 2018-09-27 ENCOUNTER — Ambulatory Visit: Payer: BLUE CROSS/BLUE SHIELD | Admitting: Advanced Practice Midwife

## 2018-10-19 ENCOUNTER — Ambulatory Visit (INDEPENDENT_AMBULATORY_CARE_PROVIDER_SITE_OTHER): Payer: BLUE CROSS/BLUE SHIELD | Admitting: Family Medicine

## 2018-10-19 VITALS — BP 108/74 | Wt 148.5 lb

## 2018-10-19 DIAGNOSIS — Z30431 Encounter for routine checking of intrauterine contraceptive device: Secondary | ICD-10-CM

## 2018-10-19 NOTE — Patient Instructions (Signed)

## 2018-10-19 NOTE — Progress Notes (Signed)
   GYNECOLOGY OFFICE PROGRESS NOTE  History:  32 y.o. [id number] here today for today for IUD string check;  IUD was placed  08/30/18. No complaints about the IUD, no concerning side effects.  The following portions of the patient's history were reviewed and updated as appropriate: allergies, current medications, past family history, past medical history, past social history, past surgical history and problem list. Last pap smear on 02/15/2018 was normal.  Review of Systems:  Pertinent items are noted in HPI.   Objective:  Physical Exam Blood pressure 108/74, weight 148 lb 8 oz (67.4 kg), unknown if currently breastfeeding. CONSTITUTIONAL: Well-developed, well-nourished female in no acute distress.  HENT:  Normocephalic, atraumatic. External right and left ear normal. Oropharynx is clear and moist EYES: Conjunctivae and EOM are normal. Pupils are equal, round, and reactive to light. No scleral icterus.  NECK: Normal range of motion CARDIOVASCULAR: Normal heart rate noted RESPIRATORY: Effort and breath sounds normal, no problems with respiration noted ABDOMEN: Soft, no distention noted.   PELVIC: Normal appearing external genitalia; normal appearing vaginal mucosa and cervix.  Moderate amount of blood in vaginal vault cleaned with [place] swabs. IUD strings visualized, about 5 cm in length outside cervix.   Assessment & Plan:  Normal IUD check. Patient to keep IUD in place for five years; can come in for removal if she desires pregnancy within the next five years. Routine preventative health maintenance measures emphasized.   Gwenevere Abbot, MD  [facility] Fellow  Center for [employer], [facility]

## 2019-08-17 ENCOUNTER — Other Ambulatory Visit: Payer: Self-pay | Admitting: Family

## 2019-08-17 DIAGNOSIS — N63 Unspecified lump in unspecified breast: Secondary | ICD-10-CM

## 2019-08-23 ENCOUNTER — Ambulatory Visit
Admission: RE | Admit: 2019-08-23 | Discharge: 2019-08-23 | Disposition: A | Discharge status: Home / Self Care | Payer: BLUE CROSS/BLUE SHIELD | Source: Ambulatory Visit | Attending: Family | Admitting: Family

## 2019-08-23 ENCOUNTER — Other Ambulatory Visit: Payer: Self-pay

## 2019-08-23 DIAGNOSIS — N63 Unspecified lump in unspecified breast: Secondary | ICD-10-CM

## 2020-02-26 IMAGING — US US MFM OB DETAIL+14 WK
1 series · 14 of 28 positions shown · non-contrast
Comparison: none

[Series 1: us mfm ob detail+14 wk · 94 acquisitions, 14 frames shown]
[im 4/94]
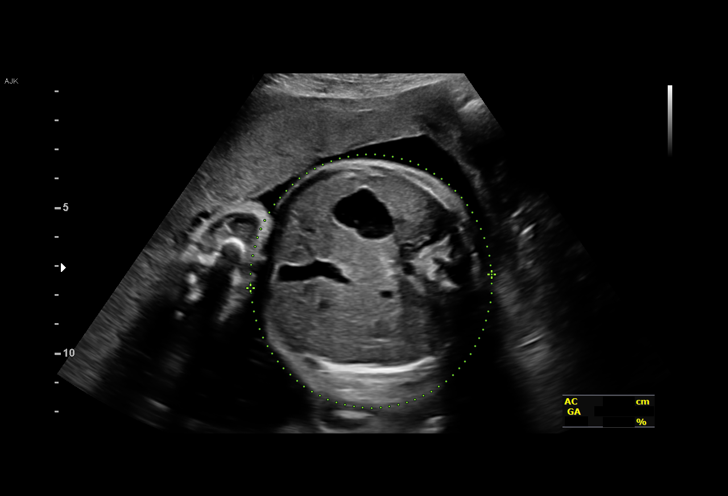
[im 11/94]
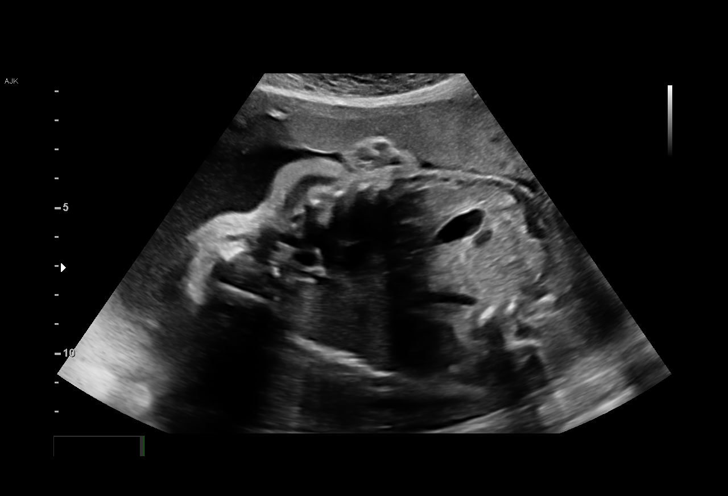
[im 18/94]
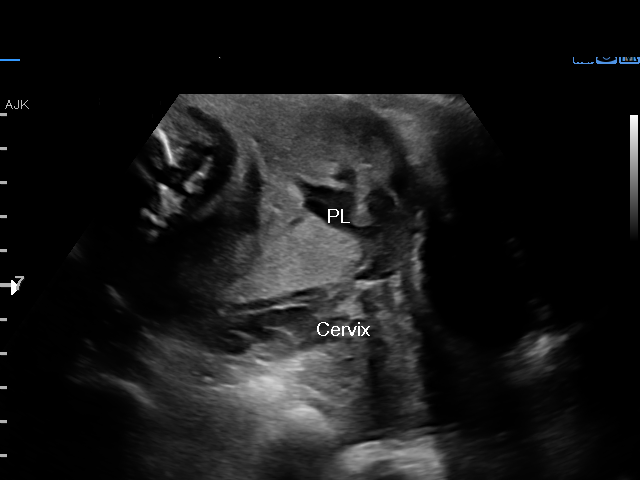
[im 25/94]
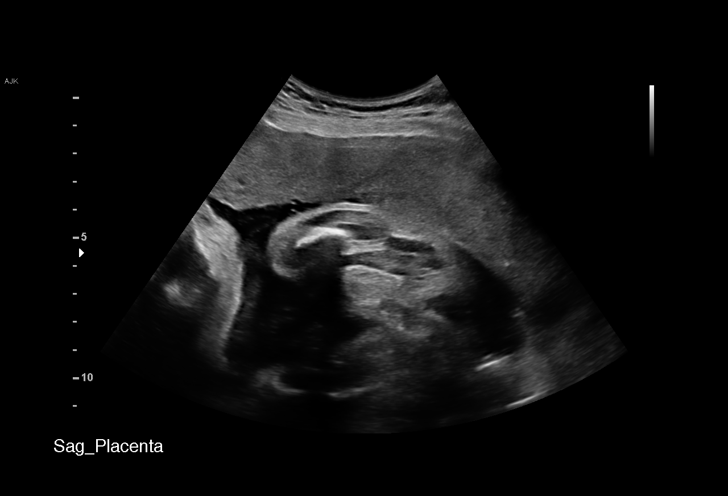
[im 32/94]
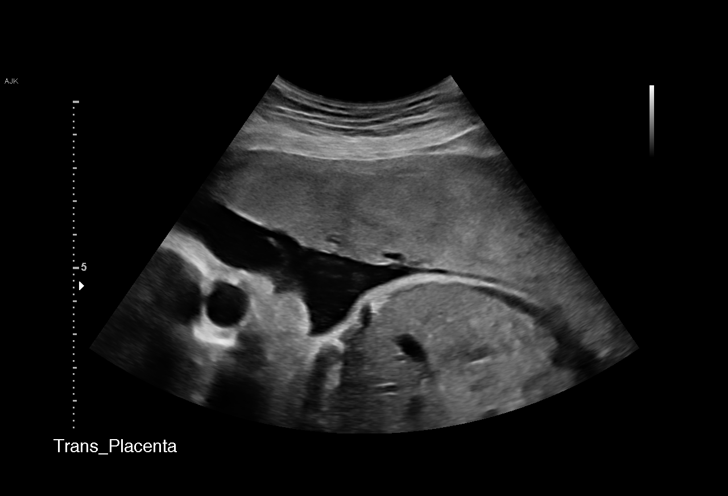
[im 38/94]
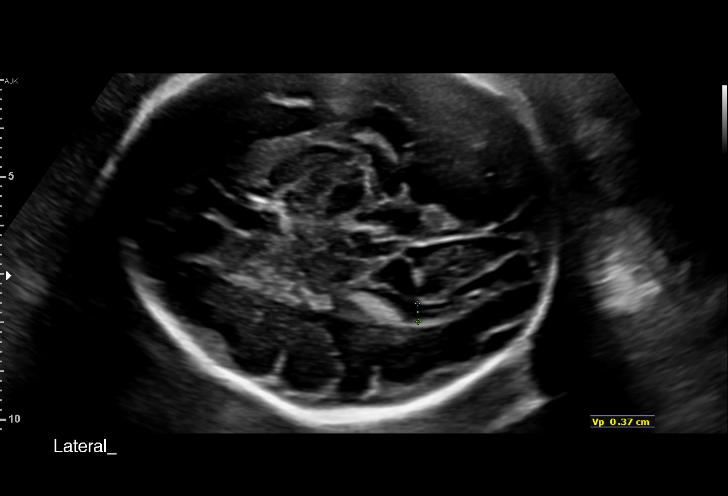
[im 45/94]
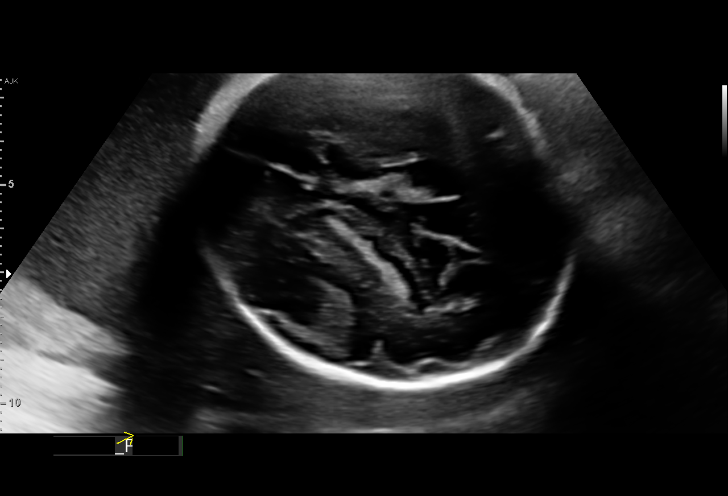
[im 52/94]
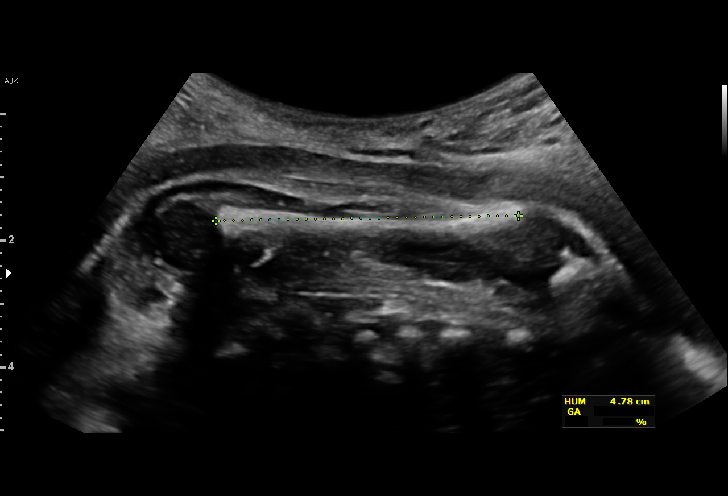
[im 59/94]
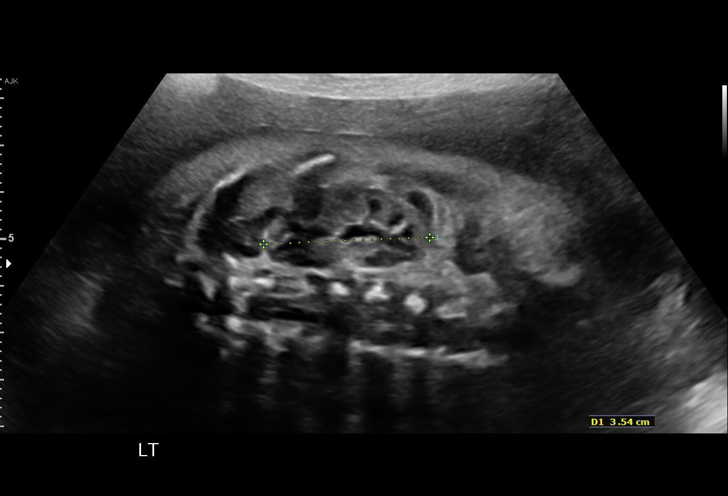
[im 66/94]
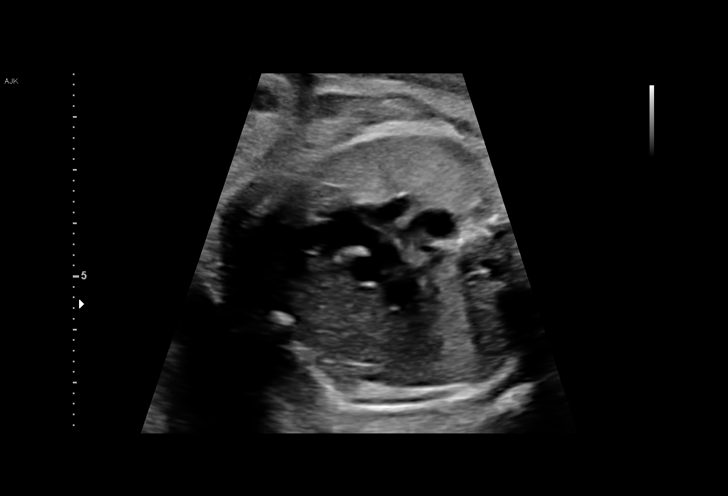
[im 73/94]
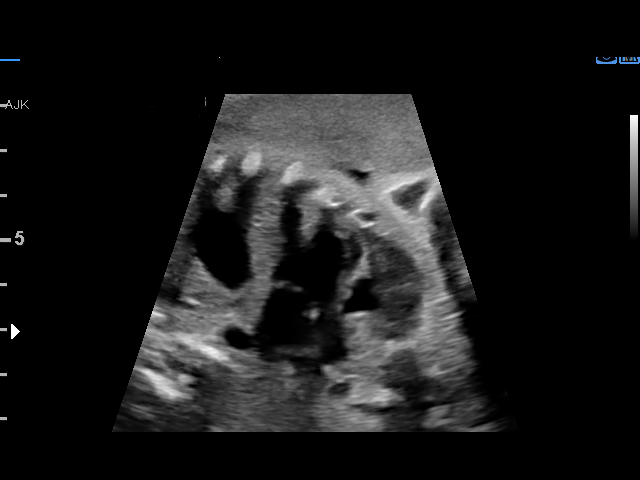
[im 80/94]
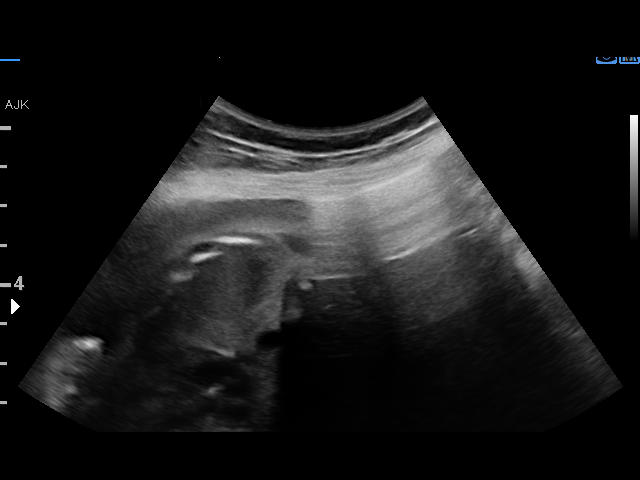
[im 87/94]
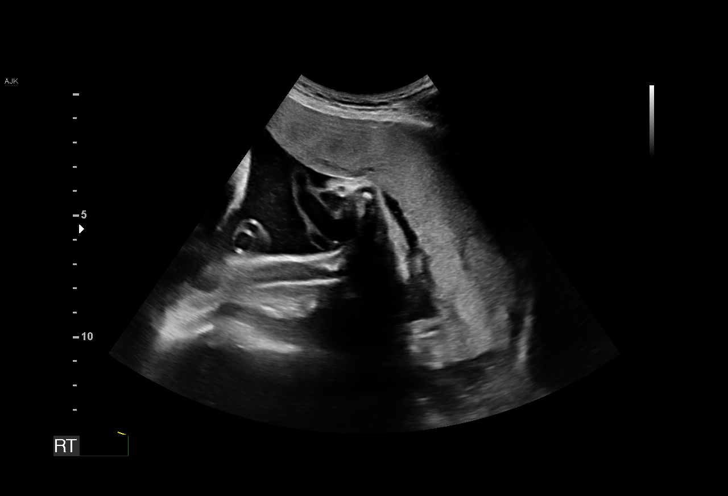
[im 94/94]
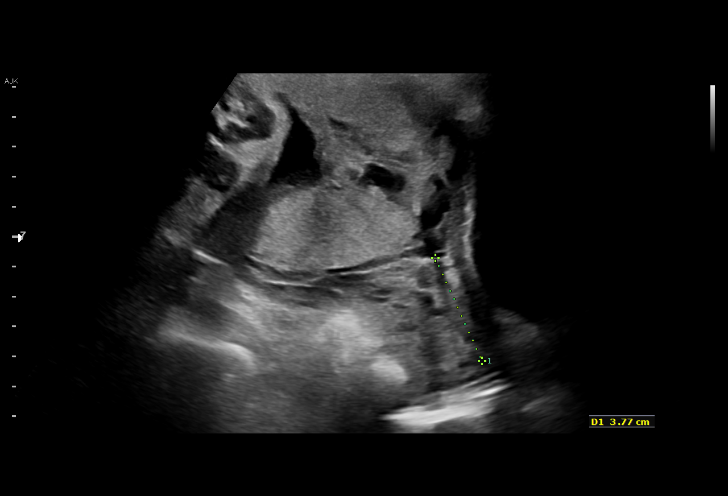

[14 of 28 positions shown; findings below may reference images not displayed]

OB/Gyn Clinic

1  RTOYOTA [id number]          [id number]      [id number]     [id number]
Indications

29 weeks gestation of pregnancy
Placenta previa specified as without
hemorrhage, third trimester
Encounter for antenatal screening for
malformations
Maternal care for known or suspected poor
fetal growth, third trimester, fetus 1 (not
found)
OB History

Blood Type:            Height:  5'2"   Weight (lb):  158       BMI:
Gravidity:    3         Term:   2        Prem:   0        SAB:   0
TOP:          0       Ectopic:  0        Living: 2
Fetal Evaluation

Num Of Fetuses:     1
Fetal Heart         154
Rate(bpm):
Cardiac Activity:   Observed
Presentation:       Breech
Placenta:           Anterior previa
P. Cord Insertion:  Visualized, central

Amniotic Fluid
AFI FV:      Subjectively within normal limits
AFI Sum(cm)     %Tile       Largest Pocket(cm)
12.71           35

RUQ(cm)       RLQ(cm)       LUQ(cm)        LLQ(cm)
3.06
Biometry

BPD:      75.9  mm     G. Age:  30w 3d         61  %    CI:        76.47   %    70 - 86
FL/HC:      20.1   %    19.2 -
HC:       275   mm     G. Age:  30w 0d         25  %    HC/AC:      1.05        0.99 -
AC:      261.7  mm     G. Age:  30w 2d         62  %    FL/BPD:     72.9   %    71 - 87
FL:       55.3  mm     G. Age:  29w 1d         21  %    FL/AC:      21.1   %    20 - 24
HUM:      48.2  mm     G. Age:  28w 2d         22  %
CER:      36.3  mm     G. Age:  31w 2d         71  %

Est. FW:    4991  gm      3 lb 4 oz     56  %
Gestational Age

LMP:           30w 6d        Date:  11/03/17                 EDD:   08/10/18
U/S Today:     30w 0d                                        EDD:   08/16/18
Best:          29w 5d     Det. By:  Previous Ultrasound      EDD:   08/18/18
(03/25/18)
Anatomy

Cranium:               Appears normal         Aortic Arch:            Appears normal
Cavum:                 Appears normal         Ductal Arch:            Not well visualized
Ventricles:            Appears normal         Diaphragm:              Appears normal
Choroid Plexus:        Appears normal         Stomach:                Appears normal, left
sided
Cerebellum:            Appears normal         Abdomen:                Appears normal
Posterior Fossa:       Not well visualized    Abdominal Wall:         Not well visualized
Nuchal Fold:           Not applicable (>20    Cord Vessels:           Appears normal (3
wks GA)                                        vessel cord)
Face:                  Appears normal         Kidneys:                Appear normal
(orbits and profile)
Lips:                  Appears normal         Bladder:                Appears normal
Thoracic:              Appears normal         Spine:                  Appears normal
Heart:                 Appears normal         Upper Extremities:      LT nml, Rt nws
(4CH, axis, and
situs)
RVOT:                  Appears normal         Lower Extremities:      Appears normal
LVOT:                  Appears normal

Other:  Fetus appears to be a female. RT heel visualized. Technically difficult
due to advanced gestational age.
Cervix Uterus Adnexa

Cervix
Length:           4.15  cm.
Normal appearance by transabdominal scan.

Uterus
No abnormality visualized.

Left Ovary
Not visualized.
Right Ovary
Not visualized.

Adnexa:       No abnormality visualized.
Impression

Singleton intrauterine pregnancy at 29+5 weeks with
supected IUGR here for evaluation
Review of the anatomy shows no sonographic markers for
aneuploidy or structural anomalies
However, views of the posterior fossa and ductal arch should
be considered suboptimal secondary to fetal position
Amniotic fluid volume is normal
Estimated fetal weight shows growth in the 56th percentile
There is an anterior complete previa
Recommendations

Discussed findings with patient.
Recommend pelvic rest and repeat scan in 4 weeks

## 2021-05-13 IMAGING — MG MM DIGITAL DIAGNOSTIC BILAT W/ TOMO W/ CAD
6 of 10 series · 6 of 30 positions shown · non-contrast
Comparison: Previous exam(s).

CLINICAL DATA: Here for a palpable area of concern in the left
breast. The patient reports that approximately 1 month ago a
palpable area of redness in the skin was identified and the patient
was able to drain white fluid from area. The patient reports that
her sister was diagnosed with breast cancer at the age of 35.

EXAM:
DIGITAL DIAGNOSTIC BILATERAL MAMMOGRAM WITH CAD AND TOMO
ULTRASOUND LEFT BREAST

[R MLO synth-2D]
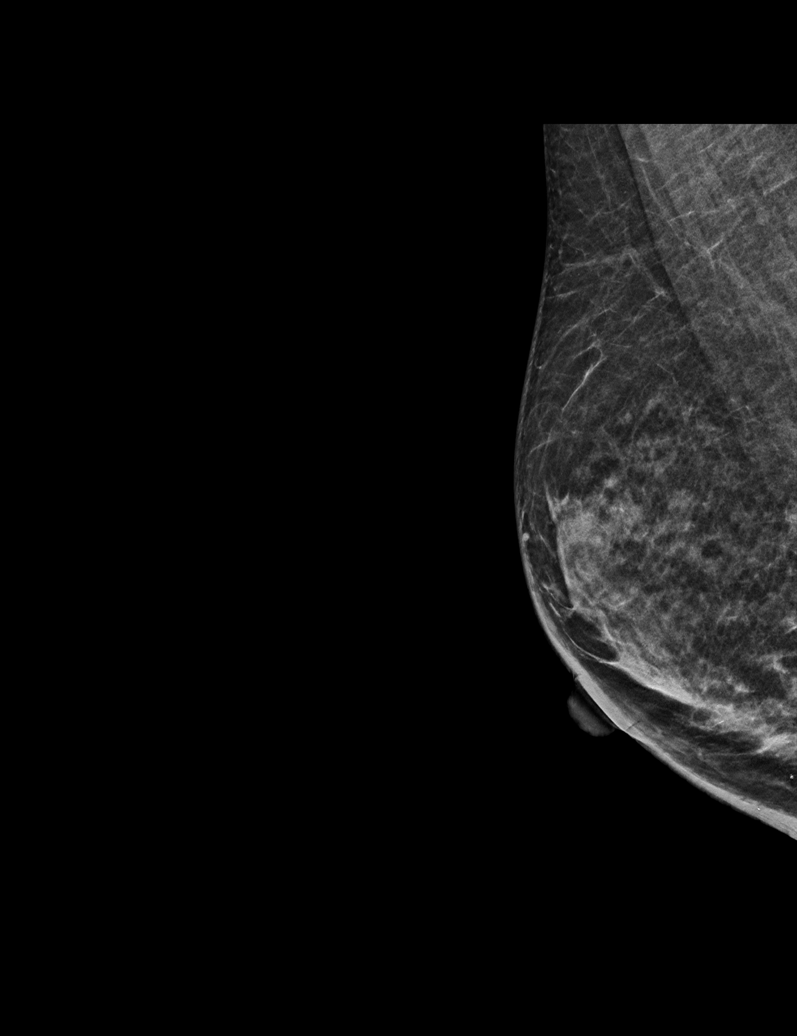

[L MLO synth-2D (1 of 2)]
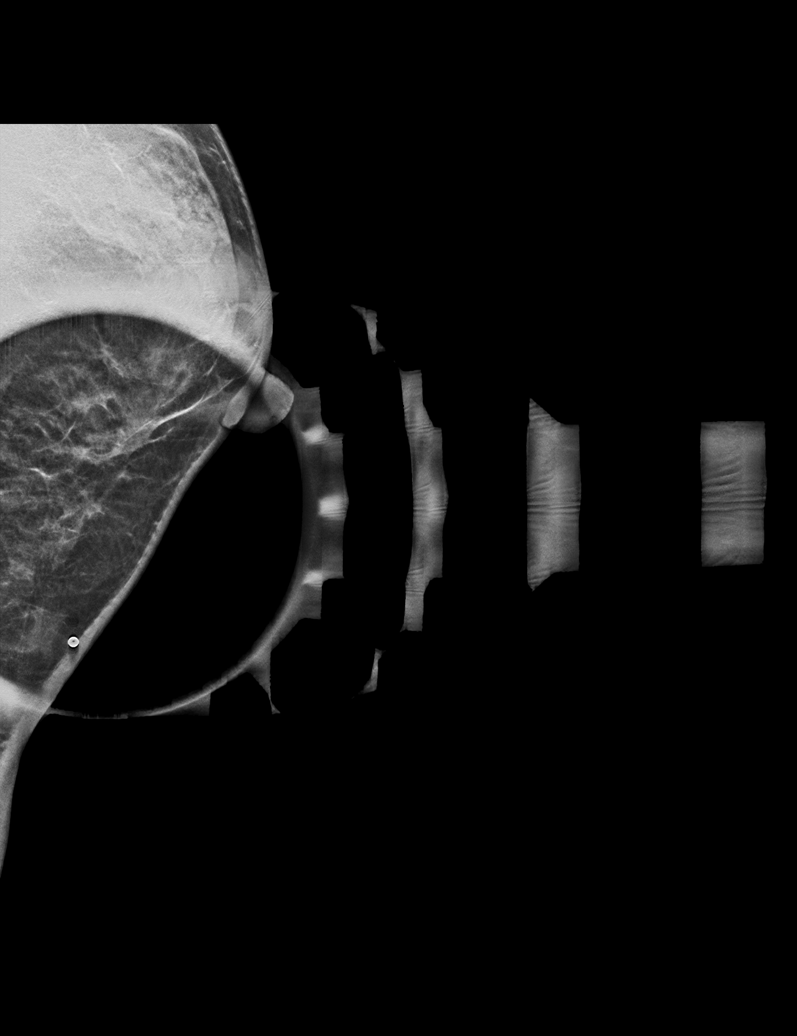

[R CC synth-2D]
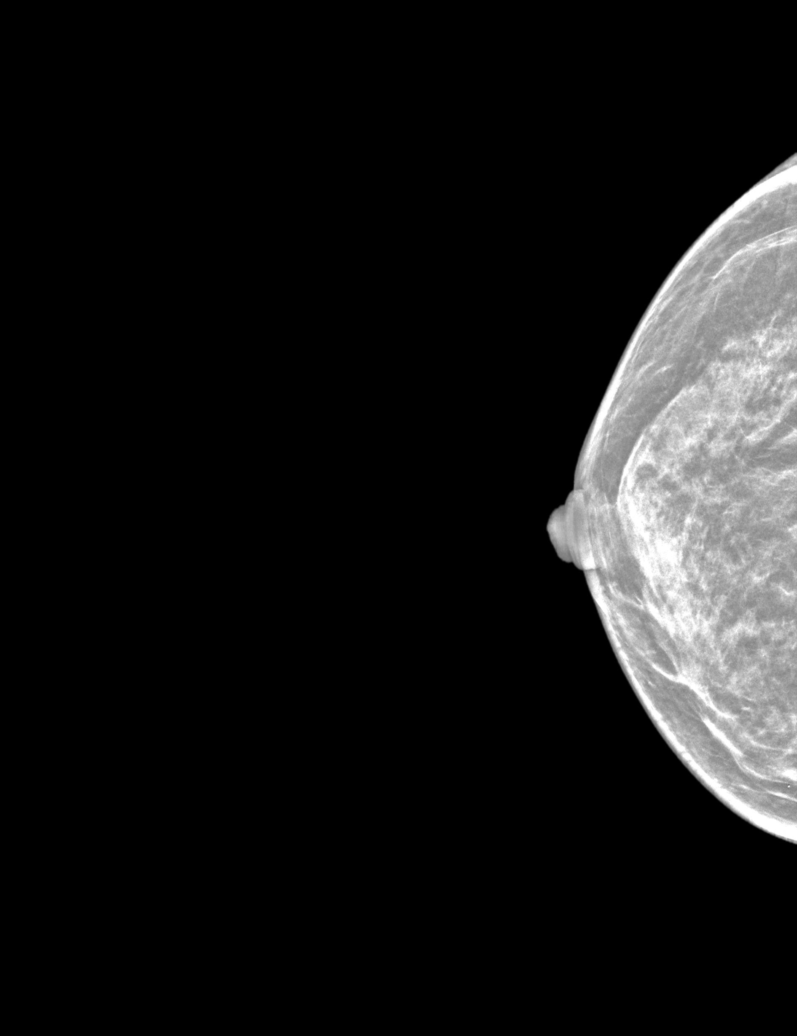

[L CC synth-2D]
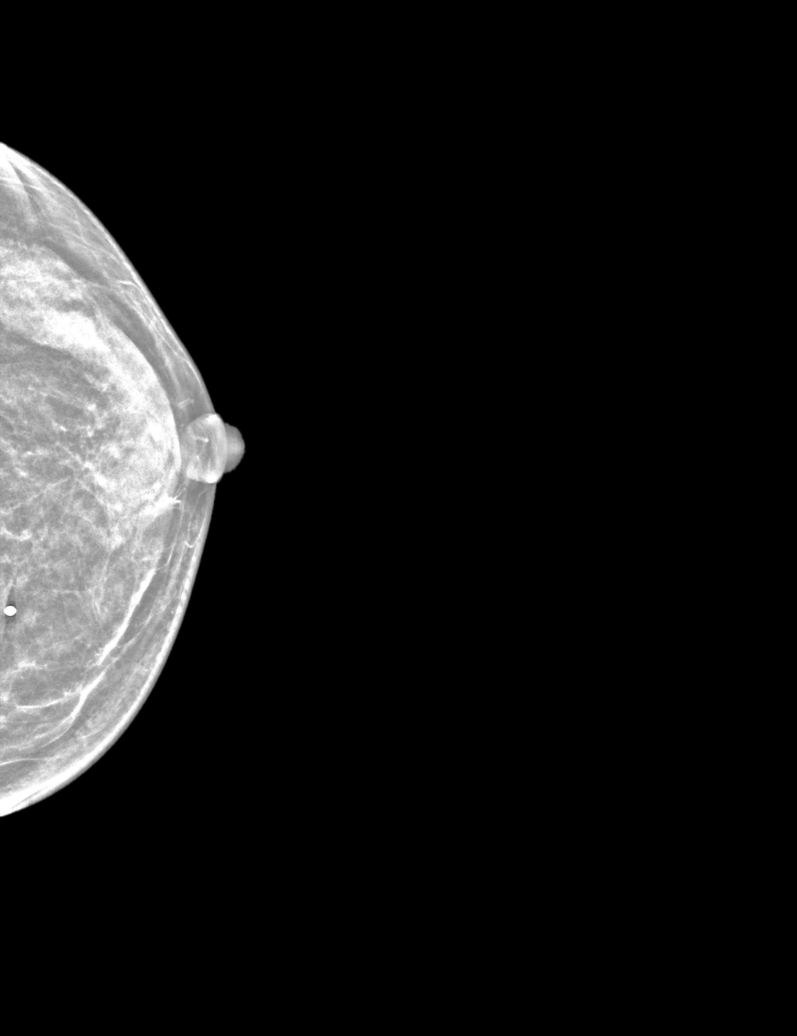

[L MLO synth-2D (2 of 2)]
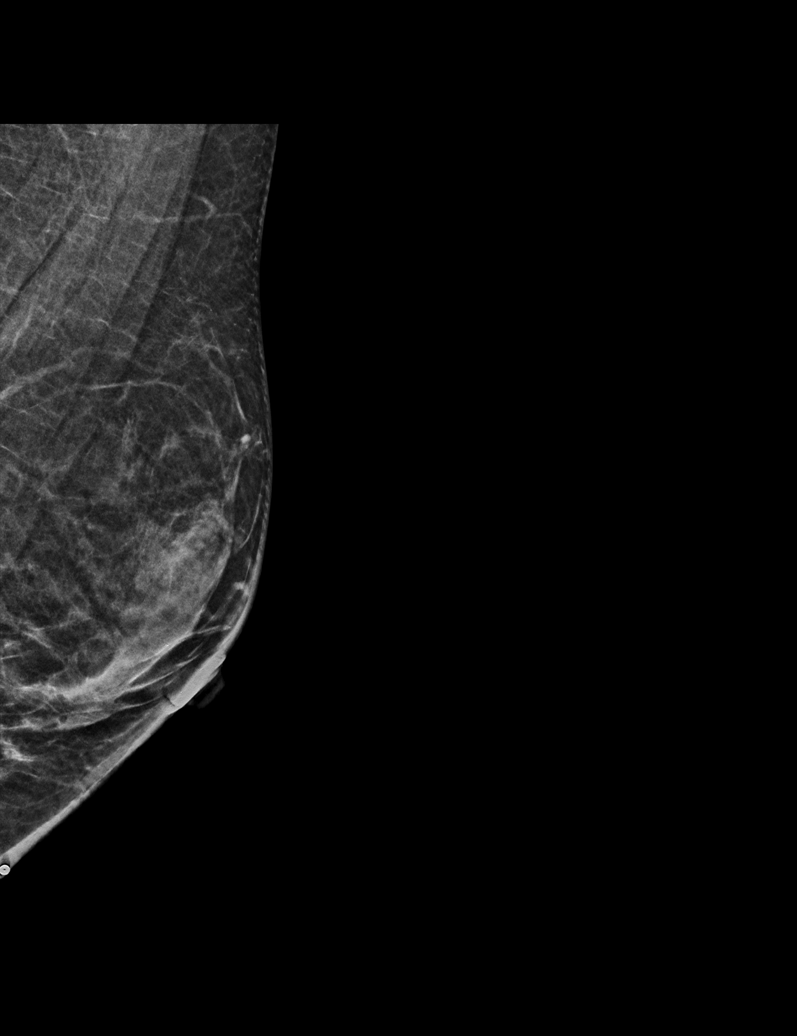

[L CC tomo · tomo slice 19/36.0]
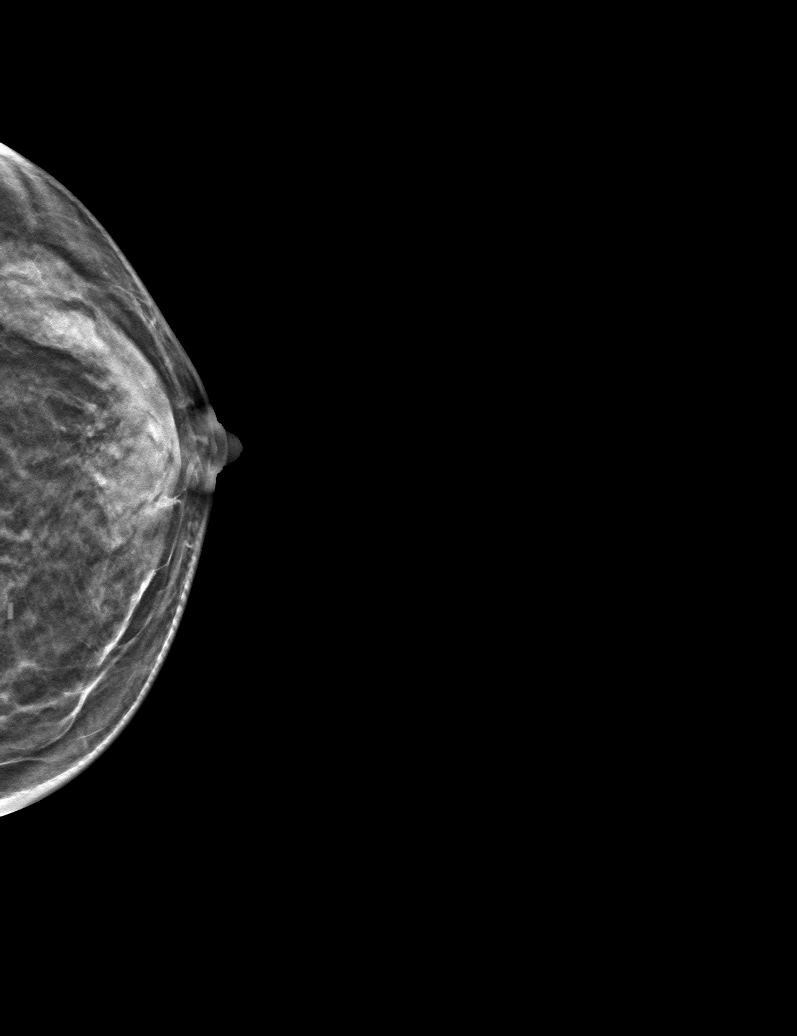

[6 of 30 positions shown; findings below may reference images not displayed]

ACR Breast Density Category c: The breast tissue is heterogeneously
dense, which may obscure small masses.
FINDINGS: Spot compression view over the palpable area of concern in the
inferior left breast demonstrates mild skin thickening in this area.
Otherwise, no suspicious mass, microcalcification, or other finding
is identified in either breast.

Mammographic images were processed with CAD.

On physical exam, there is an area of skin discoloration which is a
brown/red as well as a small palpable mass within the dermis.

Targeted ultrasound is performed, showing an intradermal mass
measuring 1.3 x 0.5 x 0.8 cm. This corresponds to the palpable area
of concern.
IMPRESSION: Intradermal mass in the inferior left breast is consistent with a
sebaceous cyst versus epidermal inclusion cyst. No evidence of
malignancy in either breast.

RECOMMENDATION:
Surgical consultation could be considered if the patient desires
excision of the sebaceous cyst versus epidermal inclusion cyst.
Given the patient's family history, recommend risk assessment as
well as routine annual screening mammogram in 1 year.

I have discussed the findings and recommendations with the patient.
If applicable, a reminder letter will be sent to the patient
regarding the next appointment.

BI-RADS CATEGORY  2: Benign.

## 2022-09-02 ENCOUNTER — Ambulatory Visit
Admission: RE | Admit: 2022-09-02 | Discharge: 2022-09-02 | Disposition: A | Discharge status: Home / Self Care | Payer: Self-pay | Source: Ambulatory Visit | Attending: Nurse Practitioner | Admitting: Nurse Practitioner

## 2022-09-02 ENCOUNTER — Other Ambulatory Visit: Payer: Self-pay | Admitting: Nurse Practitioner

## 2022-09-02 DIAGNOSIS — R0600 Dyspnea, unspecified: Secondary | ICD-10-CM
